# Patient Record
Sex: Male | Born: 1955 | Race: Black or African American | Hispanic: No | Smoking: Former smoker
Health system: Southern US, Community
[De-identification: ages and names within clinical notes are randomized; demographics above are authoritative.]

## PROBLEM LIST (undated history)

## (undated) DIAGNOSIS — S069X9A Unspecified intracranial injury with loss of consciousness of unspecified duration, initial encounter: Secondary | ICD-10-CM

## (undated) DIAGNOSIS — S069XAA Unspecified intracranial injury with loss of consciousness status unknown, initial encounter: Secondary | ICD-10-CM

## (undated) DIAGNOSIS — D693 Immune thrombocytopenic purpura: Secondary | ICD-10-CM

## (undated) DIAGNOSIS — F0390 Unspecified dementia without behavioral disturbance: Secondary | ICD-10-CM

## (undated) DIAGNOSIS — D509 Iron deficiency anemia, unspecified: Secondary | ICD-10-CM

## (undated) HISTORY — DX: Immune thrombocytopenic purpura: D69.3

## (undated) HISTORY — PX: CHOLECYSTECTOMY: SHX55

## (undated) HISTORY — DX: Iron deficiency anemia, unspecified: D50.9

---

## 2014-06-03 ENCOUNTER — Ambulatory Visit: Payer: Self-pay | Admitting: Gastroenterology

## 2014-06-06 LAB — PATHOLOGY REPORT

## 2016-03-08 ENCOUNTER — Emergency Department: Payer: Medicaid Other

## 2016-03-08 ENCOUNTER — Encounter: Payer: Self-pay | Admitting: Emergency Medicine

## 2016-03-08 ENCOUNTER — Emergency Department
Admission: EM | Admit: 2016-03-08 | Discharge: 2016-03-08 | Disposition: A | Discharge status: Home / Self Care | Payer: Medicaid Other | Attending: Emergency Medicine | Admitting: Emergency Medicine

## 2016-03-08 DIAGNOSIS — R4182 Altered mental status, unspecified: Secondary | ICD-10-CM | POA: Diagnosis present

## 2016-03-08 DIAGNOSIS — Z79899 Other long term (current) drug therapy: Secondary | ICD-10-CM | POA: Insufficient documentation

## 2016-03-08 DIAGNOSIS — R410 Disorientation, unspecified: Secondary | ICD-10-CM | POA: Insufficient documentation

## 2016-03-08 DIAGNOSIS — Z7982 Long term (current) use of aspirin: Secondary | ICD-10-CM | POA: Diagnosis not present

## 2016-03-08 LAB — COMPREHENSIVE METABOLIC PANEL
ALBUMIN: 4.4 g/dL (ref 3.5–5.0)
ALK PHOS: 104 U/L (ref 38–126)
ALT: 19 U/L (ref 17–63)
AST: 42 U/L — AB (ref 15–41)
Anion gap: 9 (ref 5–15)
BUN: 14 mg/dL (ref 6–20)
CALCIUM: 9.1 mg/dL (ref 8.9–10.3)
CO2: 25 mmol/L (ref 22–32)
CREATININE: 1.11 mg/dL (ref 0.61–1.24)
Chloride: 106 mmol/L (ref 101–111)
GFR calc Af Amer: >60 mL/min (ref >60)
GFR calc non Af Amer: >60 mL/min (ref >60)
GLUCOSE: 103 mg/dL — AB (ref 65–99)
Potassium: 5.5 mmol/L — ABNORMAL HIGH (ref 3.5–5.1)
Sodium: 140 mmol/L (ref 135–145)
Total Bilirubin: 1 mg/dL (ref 0.3–1.2)
Total Protein: 8 g/dL (ref 6.5–8.1)

## 2016-03-08 LAB — CBC
HCT: 27.3 % — ABNORMAL LOW (ref 40.0–52.0)
Hemoglobin: 8.2 g/dL — ABNORMAL LOW (ref 13.0–18.0)
MCH: 20 pg — AB (ref 26.0–34.0)
MCHC: 30 g/dL — AB (ref 32.0–36.0)
MCV: 66.5 fL — AB (ref 80.0–100.0)
PLATELETS: 59 10*3/uL — AB (ref 150–440)
RBC: 4.1 MIL/uL — ABNORMAL LOW (ref 4.40–5.90)
RDW: 22.7 % — ABNORMAL HIGH (ref 11.5–14.5)
WBC: 7.6 10*3/uL (ref 3.8–10.6)

## 2016-03-08 LAB — CK: CK TOTAL: 393 U/L (ref 49–397)

## 2016-03-08 MED ORDER — SODIUM CHLORIDE 0.9 % IV BOLUS (SEPSIS)
1000.0000 mL | Freq: Once | INTRAVENOUS | Status: AC
  Administered 2016-03-08: 1000 mL via INTRAVENOUS

## 2016-03-08 MED ORDER — PIPERACILLIN-TAZOBACTAM 3.375 G IVPB
INTRAVENOUS | Status: AC
  Filled 2016-03-08: qty 50

## 2016-03-08 NOTE — Discharge Instructions (Signed)
Confusion Confusion is the inability to think with your usual speed or clarity. Confusion may come on quickly or slowly over time. How quickly the confusion comes on depends on the cause. Confusion can be due to any number of causes. CAUSES   Concussion, head injury, or head trauma.  Seizures.  Stroke.  Fever.  Brain tumor.  Age related decreased brain function (dementia).  Heightened emotional states like rage or terror.  Mental illness in which the person loses the ability to determine what is real and what is not (hallucinations).  Infections such as a urinary tract infection (UTI).  Toxic effects from alcohol, drugs, or prescription medicines.  Dehydration and an imbalance of salts in the body (electrolytes).  Lack of sleep.  Low blood sugar (diabetes).  Low levels of oxygen from conditions such as chronic lung disorders.  Drug interactions or other medicine side effects.  Nutritional deficiencies, especially niacin, thiamine, vitamin C, or vitamin B.  Sudden drop in body temperature (hypothermia).  Change in routine, such as when traveling or hospitalized. SIGNS AND SYMPTOMS  People often describe their thinking as cloudy or unclear when they are confused. Confusion can also include feeling disoriented. That means you are unaware of where or who you are. You may also not know what the date or time is. If confused, you may also have difficulty paying attention, remembering, and making decisions. Some people also act aggressively when they are confused.  DIAGNOSIS  The medical evaluation of confusion may include:  Blood and urine tests.  X-rays.  Brain and nervous system tests.  Analyzing your brain waves (electroencephalogram or EEG).  Magnetic resonance imaging (MRI) of your head.  Computed tomography (CT) scan of your head.  Mental status tests in which your health care provider may ask many questions. Some of these questions may seem silly or strange,  but they are a very important test to help diagnose and treat confusion. TREATMENT  An admission to the hospital may not be needed, but a person with confusion should not be left alone. Stay with a family member or friend until the confusion clears. Avoid alcohol, pain relievers, or sedative drugs until you have fully recovered. Do not drive until directed by your health care provider. HOME CARE INSTRUCTIONS  What family and friends can do:  To find out if someone is confused, ask the person to state his or her name, age, and the date. If the person is unsure or answers incorrectly, he or she is confused.  Always introduce yourself, no matter how well the person knows you.  Often remind the person of his or her location.  Place a calendar and clock near the confused person.  Help the person with his or her medicines. You may want to use a pill box, an alarm as a reminder, or give the person each dose as prescribed.  Talk about current events and plans for the day.  Try to keep the environment calm, quiet, and peaceful.  Make sure the person keeps follow-up visits with his or her health care provider. PREVENTION  Ways to prevent confusion:  Avoid alcohol.  Eat a balanced diet.  Get enough sleep.  Take medicine only as directed by your health care provider.  Do not become isolated. Spend time with other people and make plans for your days.  Keep careful watch on your blood sugar levels if you are diabetic. SEEK IMMEDIATE MEDICAL CARE IF:   You develop severe headaches, repeated vomiting, seizures, blackouts, or   slurred speech.  There is increasing confusion, weakness, numbness, restlessness, or personality changes.  You develop a loss of balance, have marked dizziness, feel uncoordinated, or fall.  You have delusions, hallucinations, or develop severe anxiety.  Your family members think you need to be rechecked.   This information is not intended to replace advice given  to you by your health care provider. Make sure you discuss any questions you have with your health care provider.   Document Released: 10/11/2004 Document Revised: 09/24/2014 Document Reviewed: 10/09/2013 Elsevier Interactive Patient Education 2016 [employer] Inc.  
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
## 2016-03-08 NOTE — ED Notes (Signed)
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
## 2016-03-08 NOTE — ED Notes (Signed)
This RN spoke with patient cousin, Cathleen Fears. Pt cousin states pt lives at home with his mother, pt left home yesterday morning at 10 am and had not returned home, Thayer Headings notified the [employer] of pt missing. Thayer Headings reports pt has done this multiple times during the past year but have been able to find him when he leaves the home.

## 2016-04-10 ENCOUNTER — Inpatient Hospital Stay: Payer: Medicaid Other

## 2016-04-10 ENCOUNTER — Inpatient Hospital Stay: Payer: Medicaid Other | Attending: Hematology and Oncology | Admitting: Hematology and Oncology

## 2016-04-10 ENCOUNTER — Other Ambulatory Visit: Payer: Self-pay | Admitting: *Deleted

## 2016-04-10 ENCOUNTER — Encounter (INDEPENDENT_AMBULATORY_CARE_PROVIDER_SITE_OTHER): Payer: Self-pay

## 2016-04-10 ENCOUNTER — Encounter: Payer: Self-pay | Admitting: Hematology and Oncology

## 2016-04-10 VITALS — BP 121/80 | HR 53 | Temp 97.9°F | Resp 18 | Ht 70.0 in | Wt 181.9 lb

## 2016-04-10 DIAGNOSIS — Z8042 Family history of malignant neoplasm of prostate: Secondary | ICD-10-CM | POA: Insufficient documentation

## 2016-04-10 DIAGNOSIS — Z87891 Personal history of nicotine dependence: Secondary | ICD-10-CM | POA: Diagnosis not present

## 2016-04-10 DIAGNOSIS — Z79899 Other long term (current) drug therapy: Secondary | ICD-10-CM | POA: Insufficient documentation

## 2016-04-10 DIAGNOSIS — D693 Immune thrombocytopenic purpura: Secondary | ICD-10-CM | POA: Insufficient documentation

## 2016-04-10 DIAGNOSIS — D696 Thrombocytopenia, unspecified: Secondary | ICD-10-CM | POA: Insufficient documentation

## 2016-04-10 DIAGNOSIS — D649 Anemia, unspecified: Secondary | ICD-10-CM

## 2016-04-10 DIAGNOSIS — Z7982 Long term (current) use of aspirin: Secondary | ICD-10-CM | POA: Diagnosis not present

## 2016-04-10 DIAGNOSIS — Z87828 Personal history of other (healed) physical injury and trauma: Secondary | ICD-10-CM | POA: Diagnosis not present

## 2016-04-10 DIAGNOSIS — Z801 Family history of malignant neoplasm of trachea, bronchus and lung: Secondary | ICD-10-CM | POA: Diagnosis not present

## 2016-04-10 DIAGNOSIS — Z8041 Family history of malignant neoplasm of ovary: Secondary | ICD-10-CM | POA: Diagnosis not present

## 2016-04-10 LAB — COMPREHENSIVE METABOLIC PANEL
ALT: 11 U/L — ABNORMAL LOW (ref 17–63)
AST: 16 U/L (ref 15–41)
Albumin: 4.2 g/dL (ref 3.5–5.0)
Alkaline Phosphatase: 95 U/L (ref 38–126)
Anion gap: 6 (ref 5–15)
BUN: 16 mg/dL (ref 6–20)
CO2: 29 mmol/L (ref 22–32)
Calcium: 8.9 mg/dL (ref 8.9–10.3)
Chloride: 104 mmol/L (ref 101–111)
Creatinine, Ser: 0.82 mg/dL (ref 0.61–1.24)
GFR calc Af Amer: >60 mL/min (ref >60)
GFR calc non Af Amer: >60 mL/min (ref >60)
Glucose, Bld: 98 mg/dL (ref 65–99)
Potassium: 3.4 mmol/L — ABNORMAL LOW (ref 3.5–5.1)
Sodium: 139 mmol/L (ref 135–145)
Total Bilirubin: 0.7 mg/dL (ref 0.3–1.2)
Total Protein: 7.8 g/dL (ref 6.5–8.1)

## 2016-04-10 LAB — IRON AND TIBC
Iron: 196 ug/dL — ABNORMAL HIGH (ref 45–182)
Saturation Ratios: 43 % — ABNORMAL HIGH (ref 17.9–39.5)
TIBC: 457 ug/dL — ABNORMAL HIGH (ref 250–450)
UIBC: 261 ug/dL

## 2016-04-10 LAB — CBC WITH DIFFERENTIAL/PLATELET
Basophils Absolute: 0 K/uL (ref 0–0.1)
Basophils Relative: 1 %
Eosinophils Absolute: 0.1 K/uL (ref 0–0.7)
Eosinophils Relative: 1 %
HCT: 30.6 % — ABNORMAL LOW (ref 39.0–52.0)
Hemoglobin: 9.1 g/dL — ABNORMAL LOW (ref 13.0–17.0)
Lymphocytes Relative: 41 %
Lymphs Abs: 2.2 K/uL (ref 1.0–3.6)
MCH: 20.6 pg — ABNORMAL LOW (ref 26.0–34.0)
MCHC: 29.9 g/dL — ABNORMAL LOW (ref 32.0–36.0)
MCV: 69 fL — ABNORMAL LOW (ref 80.0–100.0)
Monocytes Absolute: 0.7 K/uL (ref 0.2–1.0)
Monocytes Relative: 13 %
Neutro Abs: 2.4 K/uL (ref 1.4–6.5)
Neutrophils Relative %: 44 %
Platelets: 88 K/uL — ABNORMAL LOW (ref 150–440)
RBC: 4.43 MIL/uL (ref 4.40–5.90)
RDW: 25.6 % — ABNORMAL HIGH (ref 11.5–14.5)
WBC: 5.4 K/uL (ref 3.8–10.6)

## 2016-04-10 LAB — RETICULOCYTES
RBC.: 4.43 MIL/uL (ref 4.40–5.90)
Retic Count, Absolute: 146.2 10*3/uL (ref 19.0–183.0)
Retic Ct Pct: 3.3 % — ABNORMAL HIGH (ref 0.4–3.1)

## 2016-04-10 LAB — FERRITIN: Ferritin: 10 ng/mL — ABNORMAL LOW (ref 24–336)

## 2016-04-10 LAB — TSH: TSH: 0.366 u[IU]/mL (ref 0.350–4.500)

## 2016-04-10 NOTE — Progress Notes (Signed)
Patient is here for new patient evaluation no complaints or concerns today.
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
## 2016-04-11 LAB — HEPATITIS B CORE ANTIBODY, TOTAL: Hep B Core Total Ab: NEGATIVE

## 2016-04-11 LAB — HEPATITIS C ANTIBODY: HCV Ab: <0.1 s/co ratio (ref 0.0–0.9)

## 2016-04-11 LAB — HIV ANTIBODY (ROUTINE TESTING W REFLEX): HIV Screen 4th Generation wRfx: NONREACTIVE

## 2016-04-11 LAB — ANA W/REFLEX IF POSITIVE: Anti Nuclear Antibody(ANA): NEGATIVE

## 2016-04-11 LAB — HEPATITIS B SURFACE ANTIGEN: Hepatitis B Surface Ag: NEGATIVE

## 2016-04-14 ENCOUNTER — Encounter: Payer: Self-pay | Admitting: Hematology and Oncology

## 2016-04-27 ENCOUNTER — Other Ambulatory Visit: Payer: Self-pay | Admitting: *Deleted

## 2016-04-27 ENCOUNTER — Inpatient Hospital Stay: Payer: Medicaid Other | Attending: Hematology and Oncology | Admitting: Hematology and Oncology

## 2016-04-27 ENCOUNTER — Inpatient Hospital Stay: Payer: Medicaid Other | Admitting: Hematology and Oncology

## 2016-04-27 VITALS — BP 117/83 | HR 55 | Temp 96.1°F | Resp 18 | Wt 183.0 lb

## 2016-04-27 DIAGNOSIS — D696 Thrombocytopenia, unspecified: Secondary | ICD-10-CM

## 2016-04-27 DIAGNOSIS — Z87891 Personal history of nicotine dependence: Secondary | ICD-10-CM | POA: Insufficient documentation

## 2016-04-27 DIAGNOSIS — Z8042 Family history of malignant neoplasm of prostate: Secondary | ICD-10-CM | POA: Diagnosis not present

## 2016-04-27 DIAGNOSIS — Z800 Family history of malignant neoplasm of digestive organs: Secondary | ICD-10-CM | POA: Diagnosis not present

## 2016-04-27 DIAGNOSIS — Z79899 Other long term (current) drug therapy: Secondary | ICD-10-CM | POA: Diagnosis not present

## 2016-04-27 DIAGNOSIS — D509 Iron deficiency anemia, unspecified: Secondary | ICD-10-CM | POA: Diagnosis present

## 2016-04-27 DIAGNOSIS — Z7982 Long term (current) use of aspirin: Secondary | ICD-10-CM | POA: Diagnosis not present

## 2016-04-27 DIAGNOSIS — D649 Anemia, unspecified: Secondary | ICD-10-CM

## 2016-04-27 DIAGNOSIS — Z87828 Personal history of other (healed) physical injury and trauma: Secondary | ICD-10-CM | POA: Diagnosis not present

## 2016-04-27 DIAGNOSIS — D122 Benign neoplasm of ascending colon: Secondary | ICD-10-CM | POA: Diagnosis not present

## 2016-04-27 DIAGNOSIS — Z8041 Family history of malignant neoplasm of ovary: Secondary | ICD-10-CM | POA: Diagnosis not present

## 2016-04-27 NOTE — Progress Notes (Signed)
[facility] day:  05/28/16  Chief Complaint: Julian [name] is a 60 y.o. male with thrombocytopenia who seen for review of initial work-up and discussion regarding direction of therapy.  HPI:  The patient was last seen in the hematology clinic on 04/10/2016 for initial consultation.  He had thrombocytopenia of unclear duration.  He had a microcytic anemia likely due to iron deficiency.  Diet appeared good.  He denied any melena or hematochezia.  His last colonoscopy was within the past 5 years (no report available).  He has been on oral B12 since 2015.  He has been on oral iron x 3 weeks.  Hematocrit had improved from 27.1 to 30.2.  Platelet count had improved from 59,000 to 103,000.  He underwent a work-up.  CBC revealed a hematocrit of 30.6, hemoglobin 9.1, MCV 69, platelets 88,000, WBC 5400 with an ANC of 2400.  Differential was unremarkable.  Negative studies were as follows:  Hepatitis B surface antigen, hepatitis B core antibody total, hepatitis C antibody, HIV antibody, ANA, and TSH.  Ferritin was 10 (low) with a TIBC of 457 (high) and a saturation of 43%.  Retic was 3.3%.  No colonoscopy report is available.  Colonoscopy was performed by Dr. Verdie Shire on 06/03/2014.  Pathology from 06/03/2014 revealed a tubular adenoma in the ascending colon negative for high grade dysplasia and malignancy.  Symptomatically, he denies any complaints.  He is taking 1 iron pill a day.  He did not turn in guaiac cards ("not going to do it').  No past medical history on file.  Past Surgical History:  Procedure Laterality Date  . CHOLECYSTECTOMY      Family History  Problem Relation Age of Onset  . Hypertension Father   . Diabetes Father   . Lung cancer Brother   . Hypertension Maternal Grandmother   . Ovarian cancer Maternal Grandmother   . Diabetes Maternal Grandmother   . Prostate cancer Brother     Social History:  reports that he has quit  smoking. He does not have any smokeless tobacco history on file. He reports that he does not drink alcohol. His drug history is not on file.  He previously smoked 1 pack per day.  He quit smoking this year.  He does not drink alcohol.  He does not take any non prescription drugs.  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  He denies any exposure to radiation or toxins.  He was in the [employer] for a few months.  He lives with his mom.  He has 1 son.  The patient is accompanied by his cousin, Thayer Headings, today.  Allergies: No Known Allergies  Current Medications: Current Outpatient Prescriptions  Medication Sig Dispense Refill  . aspirin (ASPIRIN CHILDRENS) 81 MG chewable tablet Chew 81 mg by mouth daily.    Marland Kitchen donepezil (ARICEPT) 10 MG tablet Take 10 mg by mouth at bedtime.    . ferrous sulfate 325 (65 FE) MG tablet Take 325 mg by mouth daily with breakfast.    . gabapentin (NEURONTIN) 100 MG capsule Take 100 mg by mouth 2 (two) times daily.  11  . memantine (NAMENDA) 5 MG tablet Take 5 mg by mouth 2 (two) times daily.  5   No current facility-administered medications for this visit.     Review of Systems:  GENERAL:  No problems.  No fevers, sweats or weight loss. PERFORMANCE STATUS (ECOG):  2 HEENT:  Possible glaucoma.  No visual  changes, runny nose, sore throat, mouth sores or tenderness. Lungs: No shortness of breath or cough.  No hemoptysis. Cardiac:  No chest pain, palpitations, orthopnea, or PND. GI:  No nausea, vomiting, diarrhea, constipation, melena or hematochezia. GU:  No urgency, frequency, dysuria, or hematuria. Musculoskeletal:  No back pain.  No joint pain.  No muscle tenderness. Extremities:  No pain or swelling. Skin:  No rashes or skin changes. Neuro:  No headache, numbness or weakness, balance or coordination issues. Endocrine:  No diabetes, thyroid issues, hot flashes or night sweats. Psych:  No mood changes, depression or anxiety. Pain:  No focal pain. Review of systems:   All other systems reviewed and found to be negative.  Physical Exam: Blood pressure 117/83, pulse (!) 55, temperature (!) 96.1 F (35.6 C), temperature source Tympanic, resp. rate 18, weight 182 lb 15.7 oz (83 kg). GENERAL:  Well developed, well nourished, sitting comfortably in the exam room in no acute distress. MENTAL STATUS:  Alert and oriented to person, place and time. HEAD:  Short black hair.  Mustache.  Gray sideburns.  Normocephalic, atraumatic, face symmetric, no Cushingoid features. EYES:  Brown eyes.  No conjunctivitis or scleral icterus. NEUROLOGICAL: Unremarkable. PSYCH:  Appropriate.   No visits with results within 3 Day(s) from this visit.  Latest known visit with results is:  Appointment on 04/10/2016  Component Date Value Ref Range Status  . WBC 04/10/2016 5.4  3.8 - 10.6 K/uL Final  . RBC 04/10/2016 4.43  4.40 - 5.90 MIL/uL Final  . Hemoglobin 04/10/2016 9.1* 13.0 - 17.0 g/dL Final  . HCT 04/10/2016 30.6* 39.0 - 52.0 % Final  . MCV 04/10/2016 69.0* 80.0 - 100.0 fL Final  . MCH 04/10/2016 20.6* 26.0 - 34.0 pg Final  . MCHC 04/10/2016 29.9* 32.0 - 36.0 g/dL Final  . RDW 04/10/2016 25.6* 11.5 - 14.5 % Final  . Platelets 04/10/2016 88* 150 - 440 K/uL Final   Comment: PLATELET COUNT CONFIRMED BY SMEAR LARGE PLATELETS PRESENT   . Neutrophils Relative % 04/10/2016 44%  % Final  . Neutro Abs 04/10/2016 2.4  1.4 - 6.5 K/uL Final  . Lymphocytes Relative 04/10/2016 41%  % Final  . Lymphs Abs 04/10/2016 2.2  1.0 - 3.6 K/uL Final  . Monocytes Relative 04/10/2016 13%  % Final  . Monocytes Absolute 04/10/2016 0.7  0.2 - 1.0 K/uL Final  . Eosinophils Relative 04/10/2016 1%  % Final  . Eosinophils Absolute 04/10/2016 0.1  0 - 0.7 K/uL Final  . Basophils Relative 04/10/2016 1%  % Final  . Basophils Absolute 04/10/2016 0.0  0 - 0.1 K/uL Final  . Sodium 04/10/2016 139  135 - 145 mmol/L Final  . Potassium 04/10/2016 3.4* 3.5 - 5.1 mmol/L Final  . Chloride 04/10/2016 104  101 -  111 mmol/L Final  . CO2 04/10/2016 29  22 - 32 mmol/L Final  . Glucose, Bld 04/10/2016 98  65 - 99 mg/dL Final  . BUN 04/10/2016 16  6 - 20 mg/dL Final  . Creatinine, Ser 04/10/2016 0.82  0.61 - 1.24 mg/dL Final  . Calcium 04/10/2016 8.9  8.9 - 10.3 mg/dL Final  . Total Protein 04/10/2016 7.8  6.5 - 8.1 g/dL Final  . Albumin 04/10/2016 4.2  3.5 - 5.0 g/dL Final  . AST 04/10/2016 16  15 - 41 U/L Final  . ALT 04/10/2016 11* 17 - 63 U/L Final  . Alkaline Phosphatase 04/10/2016 95  38 - 126 U/L Final  . Total Bilirubin 04/10/2016  0.7  0.3 - 1.2 mg/dL Final  . GFR calc non Af Amer 04/10/2016 >60  >60 mL/min Final  . GFR calc Af Amer 04/10/2016 >60  >60 mL/min Final   Comment: (NOTE) The eGFR has been calculated using the CKD EPI equation. This calculation has not been validated in all clinical situations. eGFR's persistently <60 mL/min signify possible Chronic Kidney Disease.   . Anion gap 04/10/2016 6  5 - 15 Final  . Hepatitis B Surface Ag 04/11/2016 Negative  Negative Final   Comment: (NOTE) Performed At: [facility] [place], [place] [id number] Lindon Romp MD [id number]   . Hep B Core Total Ab 04/11/2016 Negative  Negative Final   Comment: (NOTE) Performed At: [facility] [place], [place] [id number] Lindon Romp MD [id number]   . HCV Ab 04/11/2016 <0.1  0.0 - 0.9 s/co ratio Final   Comment: (NOTE)                                  Negative:     < 0.8                             Indeterminate: 0.8 - 0.9                                  Positive:     > 0.9 The CDC recommends that a positive HCV antibody result be followed up with a HCV Nucleic Acid Amplification test (536144). Performed At: [facility] [place], [place] [id number] Lindon Romp MD YP:[id number]   . HIV Screen 4th Generation wRfx 04/11/2016 Non Reactive  Non Reactive Final   Comment: (NOTE) Performed At: [facility] [place], [place] [id number] Lindon Romp MD AS:[id number]   . Anit Nuclear Antibody(ANA) 04/11/2016 Negative  Negative Final   Comment: (NOTE) Performed At: [facility] [place], [place] [id number] Lindon Romp MD XB:[id number]   . Ferritin 04/10/2016 10* 24 - 336 ng/mL Final  . Iron 04/10/2016 196* 45 - 182 ug/dL Final  . TIBC 04/10/2016 457* 250 - 450 ug/dL Final  . Saturation Ratios 04/10/2016 43* 17.9 - 39.5 % Final  . UIBC 04/10/2016 261  ug/dL Final  . TSH 04/10/2016 0.366  0.350 - 4.500 uIU/mL Final  . Retic Ct Pct 04/10/2016 3.3* 0.4 - 3.1 % Final  . RBC. 04/10/2016 4.43  4.40 - 5.90 MIL/uL Final  . Retic Count, Manual 04/10/2016 146.2  19.0 - 183.0 K/uL Final    Assessment:  Julian [name] is a 60 y.o. male with thrombocytopenia of unclear duration.  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  Platelet count has improved from 59,000 to 103,000.  He has iron deficiency anemia.  Diet appears good.  He denies any melena or hematochezia.  He denies any pica. Colonoscopy on 06/03/2014 revealed a tubular adenoma in the ascending colon negative for high grade dysplasia and malignancy.  He declines guaiac cards.  He has been on oral iron (1 tablet/day) x 5 weeks.    Work-up on 04/10/2016 revealed a hematocrit of 30.6, hemoglobin 9.1, MCV 69, platelets 88,000, WBC 5400 with an ANC of 2400.  Differential was unremarkable.  Negative studies included:  hepatitis B  surface antigen, hepatitis B core antibody total, hepatitis C antibody, HIV antibody, ANA, and TSH.  Ferritin was 10 (low) with a TIBC of 457 (high) and a saturation of 43%.  Retic was 3.3%.  He has a history of head trauma in the 1970s.  Symptomatically, he denies any complaint.  Exam reveals no adenopathy or hepatosplenomegaly.  Hematocrit has improved from 27.1 to 30.2 on oral iron.   Plan: 1.  Review work-up.  Discuss iron deficiency  anemia and thrombocytopenia.  Thrombocytopenia is mild and possibly related to immune mediated thrombocytopenic purpura (ITP). 2.  Increase iron to BID. 3.  Discuss guaiac cards.  Patient declines. 4.  Abdominal ultrasound in next 1-2 weeks 5.  RTC in 1 month for MD assess, labs (CBC with diff, platelet count in blue top tube, ferritin).   Lequita Asal, MD  05/28/2016, 10:25 AM

## 2016-04-27 NOTE — Progress Notes (Signed)
States is feeling well. Offers no complaints. 

## 2016-05-11 ENCOUNTER — Ambulatory Visit
Admission: RE | Admit: 2016-05-11 | Discharge: 2016-05-11 | Disposition: A | Discharge status: Home / Self Care | Payer: Medicaid Other | Source: Ambulatory Visit | Attending: Hematology and Oncology | Admitting: Hematology and Oncology

## 2016-05-11 DIAGNOSIS — Z9049 Acquired absence of other specified parts of digestive tract: Secondary | ICD-10-CM | POA: Diagnosis not present

## 2016-05-11 DIAGNOSIS — D696 Thrombocytopenia, unspecified: Secondary | ICD-10-CM | POA: Insufficient documentation

## 2016-05-28 ENCOUNTER — Inpatient Hospital Stay: Payer: Medicaid Other | Attending: Hematology and Oncology

## 2016-05-28 ENCOUNTER — Other Ambulatory Visit: Payer: Self-pay

## 2016-05-28 ENCOUNTER — Inpatient Hospital Stay (HOSPITAL_BASED_OUTPATIENT_CLINIC_OR_DEPARTMENT_OTHER): Payer: Medicaid Other | Admitting: Hematology and Oncology

## 2016-05-28 VITALS — BP 129/86 | HR 56 | Temp 94.9°F | Resp 18 | Wt 181.9 lb

## 2016-05-28 DIAGNOSIS — D509 Iron deficiency anemia, unspecified: Secondary | ICD-10-CM | POA: Diagnosis not present

## 2016-05-28 DIAGNOSIS — Z87828 Personal history of other (healed) physical injury and trauma: Secondary | ICD-10-CM

## 2016-05-28 DIAGNOSIS — D696 Thrombocytopenia, unspecified: Secondary | ICD-10-CM

## 2016-05-28 DIAGNOSIS — Z8041 Family history of malignant neoplasm of ovary: Secondary | ICD-10-CM | POA: Diagnosis not present

## 2016-05-28 DIAGNOSIS — Z8042 Family history of malignant neoplasm of prostate: Secondary | ICD-10-CM | POA: Insufficient documentation

## 2016-05-28 DIAGNOSIS — Z79899 Other long term (current) drug therapy: Secondary | ICD-10-CM | POA: Insufficient documentation

## 2016-05-28 DIAGNOSIS — Z87891 Personal history of nicotine dependence: Secondary | ICD-10-CM | POA: Diagnosis not present

## 2016-05-28 DIAGNOSIS — D122 Benign neoplasm of ascending colon: Secondary | ICD-10-CM | POA: Insufficient documentation

## 2016-05-28 DIAGNOSIS — Z7982 Long term (current) use of aspirin: Secondary | ICD-10-CM | POA: Diagnosis not present

## 2016-05-28 DIAGNOSIS — D649 Anemia, unspecified: Secondary | ICD-10-CM

## 2016-05-28 LAB — CBC WITH DIFFERENTIAL/PLATELET
Basophils Absolute: 0 10*3/uL (ref 0–0.1)
Basophils Relative: 1 %
Eosinophils Absolute: 0.1 10*3/uL (ref 0–0.7)
Eosinophils Relative: 2 %
HCT: 41.4 % (ref 40.0–52.0)
Hemoglobin: 13.1 g/dL (ref 13.0–18.0)
Lymphocytes Relative: 50 %
Lymphs Abs: 1.9 10*3/uL (ref 1.0–3.6)
MCH: 26.3 pg (ref 26.0–34.0)
MCHC: 31.7 g/dL — ABNORMAL LOW (ref 32.0–36.0)
MCV: 82.8 fL (ref 80.0–100.0)
Monocytes Absolute: 0.4 10*3/uL (ref 0.2–1.0)
Monocytes Relative: 10 %
Neutro Abs: 1.4 10*3/uL (ref 1.4–6.5)
Neutrophils Relative %: 37 %
Platelets: 71 10*3/uL — ABNORMAL LOW (ref 150–440)
RBC: 5 MIL/uL (ref 4.40–5.90)
RDW: 27.2 % — ABNORMAL HIGH (ref 11.5–14.5)
WBC: 3.8 10*3/uL (ref 3.8–10.6)

## 2016-05-28 LAB — FERRITIN: Ferritin: 30 ng/mL (ref 24–336)

## 2016-05-28 NOTE — Progress Notes (Signed)
Patient offers no complaints today.  States he will be seeing his PCP tomorrow.

## 2016-05-28 NOTE — Progress Notes (Signed)
[facility] day:  05/28/2016  Chief Complaint: Julian [name] is a 60 y.o. male with iron deficiency anemia and mild thrombocytopenia who seen for 1 month assessment.  HPI:  The patient was last seen in the hematology clinic on 04/27/2016 for review of work-up. Evaluation revealed iron deficiency anemia. He appeared to have mild immune mediated thrombocytopenic purpura (ITP). An abdominal ultrasound was ordered to assess his liver and spleen. He was encouraged to increase oral iron from 1 tablet a day to 2 tablets a day. Hematocrit was improving.  Abdominal ultrasound on 05/11/2016 revealed a normal spleen and liver.  There was a possible 3.3 cm proximal abdominal aortic aneurysm.  Recommendation was for followup by ultrasound in 3 years.   Symptomatically, he denies any complaint.  He is taking 2 iron pills a day and tolerating it well.  He is eating better (hamburger and green leafy vegetables).  He denies any melena or hematochezia.  History reviewed. No pertinent past medical history.  Past Surgical History:  Procedure Laterality Date  . CHOLECYSTECTOMY      Family History  Problem Relation Age of Onset  . Hypertension Father   . Diabetes Father   . Lung cancer Brother   . Hypertension Maternal Grandmother   . Ovarian cancer Maternal Grandmother   . Diabetes Maternal Grandmother   . Prostate cancer Brother     Social History:  reports that he has quit smoking. He does not have any smokeless tobacco history on file. He reports that he does not drink alcohol. His drug history is not on file.  He previously smoked 1 pack per day.  He quit smoking this year.  He does not drink alcohol.  He does not take any non prescription drugs.  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  He denies any exposure to radiation or toxins.  He was in the [employer] for a few months.  He lives with his mom.  He has 1 son.   Allergies: No Known  Allergies  Current Medications: Current Outpatient Prescriptions  Medication Sig Dispense Refill  . aspirin (ASPIRIN CHILDRENS) 81 MG chewable tablet Chew 81 mg by mouth daily.    Marland Kitchen donepezil (ARICEPT) 10 MG tablet Take 10 mg by mouth at bedtime.    . ferrous sulfate 325 (65 FE) MG tablet Take 325 mg by mouth daily with breakfast.    . gabapentin (NEURONTIN) 100 MG capsule Take 100 mg by mouth 2 (two) times daily.  11  . memantine (NAMENDA) 5 MG tablet Take 5 mg by mouth 2 (two) times daily.  5   No current facility-administered medications for this visit.     Review of Systems:  GENERAL:  Feels "ok".  No fevers, sweats or weight loss. PERFORMANCE STATUS (ECOG):  2 HEENT:  Possible glaucoma.  No visual changes, runny nose, sore throat, mouth sores or tenderness. Lungs: No shortness of breath or cough.  No hemoptysis. Cardiac:  No chest pain, palpitations, orthopnea, or PND. GI:  No nausea, vomiting, diarrhea, constipation, melena or hematochezia. GU:  No urgency, frequency, dysuria, or hematuria. Musculoskeletal:  No back pain.  No joint pain.  No muscle tenderness. Extremities:  No pain or swelling. Skin:  No rashes or skin changes. Neuro:  No headache, numbness or weakness, balance or coordination issues. Endocrine:  No diabetes, thyroid issues, hot flashes or night sweats. Psych:  No mood changes, depression or anxiety. Pain:  No focal pain. Review  of systems:  All other systems reviewed and found to be negative.  Physical Exam: Blood pressure 129/86, pulse (!) 56, temperature (!) 94.9 F (34.9 C), temperature source Tympanic, resp. rate 18, weight 181 lb 14.1 oz (82.5 kg). GENERAL:  Slightly disheveled odiferous appearing gentleman sitting comfortably in the exam room in no acute distress. MENTAL STATUS:  Alert and oriented to person, place and time. HEAD:  Short black hair.  Mustache.  Gray sideburns and beard.  Normocephalic, atraumatic, face symmetric, no Cushingoid  features. EYES:  Brown eyes.  Pupils equal round and reactive to light and accomodation.  No conjunctivitis or scleral icterus. ENT:  Oropharynx clear without lesion.  Edentulous. Tongue normal. Mucous membranes moist.  RESPIRATORY:  Clear to auscultation without rales, wheezes or rhonchi. CARDIOVASCULAR:  Regular rate and rhythm without murmur, rub or gallop. ABDOMEN:  Soft, non-tender, with active bowel sounds, and no hepatosplenomegaly.  No masses. SKIN:  No rashes, ulcers or lesions. EXTREMITIES: No edema, no skin discoloration or tenderness.  No palpable cords. LYMPH NODES: No palpable cervical, supraclavicular, axillary or inguinal adenopathy  NEUROLOGICAL: Unremarkable. PSYCH:  Little sleepy.  Appropriate.   Orders Only on 05/28/2016  Component Date Value Ref Range Status  . WBC 05/28/2016 3.8  3.8 - 10.6 K/uL Final  . RBC 05/28/2016 5.00  4.40 - 5.90 MIL/uL Final  . Hemoglobin 05/28/2016 13.1  13.0 - 18.0 g/dL Final  . HCT 05/28/2016 41.4  40.0 - 52.0 % Final  . MCV 05/28/2016 82.8  80.0 - 100.0 fL Final  . MCH 05/28/2016 26.3  26.0 - 34.0 pg Final  . MCHC 05/28/2016 31.7* 32.0 - 36.0 g/dL Final  . RDW 05/28/2016 27.2* 11.5 - 14.5 % Final  . Platelets 05/28/2016 71* 150 - 440 K/uL Final   Comment: PLATELET COUNT CONFIRMED BY SMEAR LARGE PLATELETS PRESENT GIANT PLATELETS SEEN   . Neutrophils Relative % 05/28/2016 37%  % Final  . Neutro Abs 05/28/2016 1.4  1.4 - 6.5 K/uL Final  . Lymphocytes Relative 05/28/2016 50%  % Final  . Lymphs Abs 05/28/2016 1.9  1.0 - 3.6 K/uL Final  . Monocytes Relative 05/28/2016 10%  % Final  . Monocytes Absolute 05/28/2016 0.4  0.2 - 1.0 K/uL Final  . Eosinophils Relative 05/28/2016 2%  % Final  . Eosinophils Absolute 05/28/2016 0.1  0 - 0.7 K/uL Final  . Basophils Relative 05/28/2016 1%  % Final  . Basophils Absolute 05/28/2016 0.0  0 - 0.1 K/uL Final  . Ferritin 05/28/2016 30  24 - 336 ng/mL Final    Assessment:  Julian [name] is a  60 y.o. 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  Abdominal ultrasound on 05/11/2016 revealed a normal spleen and liver. Platelet count fluctuates (59,000 to 103,000).   He has iron deficiency anemia.  Diet appears good.  He denies any melena or hematochezia.  He denies any pica. Colonoscopy on 06/03/2014 revealed a tubular adenoma in the ascending colon negative for high grade dysplasia and malignancy.  He declines guaiac cards.  He has been on oral iron x 2 months.    Work-up on 04/10/2016 revealed a hematocrit of 30.6, hemoglobin 9.1, MCV 69, platelets 88,000, WBC 5400 with an ANC of 2400.  Differential was unremarkable.  Negative studies included:  hepatitis B surface antigen, hepatitis B core antibody total, hepatitis C antibody, HIV antibody, ANA, and TSH.  Ferritin was 10 (low) with a TIBC of 457 (high) and a saturation of  43%.  Retic was 3.3%.  He has a history of head trauma in the 1970s.  Symptomatically, he denies any complaint.  Exam reveals no adenopathy or hepatosplenomegaly.  Hematocrit has improved from 27.1 to 41.4 on oral iron.   Plan: 1.  Labs today:  CBC with diff, ferritin. 2.  Review abdominal ultrasound.  Liver and spleen are normal.  Discuss small aortic aneurysm which needs follow-up/monitoring. 3.  Continue oral iron until ferritin 100. 4.  RTC in 1 month for MD assess, labs (CBC with diff, ferritin, B12, folate).   Lequita Asal, MD  05/28/2016, 10:10 AM

## 2016-05-29 ENCOUNTER — Other Ambulatory Visit: Payer: Self-pay | Admitting: *Deleted

## 2016-05-29 DIAGNOSIS — D509 Iron deficiency anemia, unspecified: Secondary | ICD-10-CM

## 2016-05-29 DIAGNOSIS — D696 Thrombocytopenia, unspecified: Secondary | ICD-10-CM

## 2016-06-03 ENCOUNTER — Encounter: Payer: Self-pay | Admitting: Hematology and Oncology

## 2016-06-24 NOTE — Progress Notes (Signed)
[facility] day:  06/25/2016   Chief Complaint: [name] is a 60 y.o. male with iron deficiency anemia and mild thrombocytopenia who seen for 1 month assessment.  HPI:  The patient was last seen in the hematology clinic on 05/28/2016.  At that [name], Julian [name] was eating better (hamburger and green leafy vegetables).  Julian denied any melena or hematochezia.  Platelet count was 71,000.  Hematocrit had improved from 27.1 to 41.4 on oral iron.   During the interim, Julian has done well. Julian is taking 2 iron pills a day plus orange juice/vitamin C.  Julian denies any new medications or herbal products.  Julian does not drink alcohol.  Julian denies any infections.  Julian denies any bruising or bleeding.   History reviewed. No pertinent past medical history.  Past Surgical History:  Procedure Laterality Date  . CHOLECYSTECTOMY      Family History  Problem Relation Age of Onset  . Hypertension Father   . Diabetes Father   . Lung cancer Brother   . Hypertension Maternal Grandmother   . Ovarian cancer Maternal Grandmother   . Diabetes Maternal Grandmother   . Prostate cancer Brother     Social History:  reports that Julian has quit smoking. Julian does not have any smokeless tobacco history on file. Julian reports that Julian does not drink alcohol. His drug history is not on file.  Julian previously smoked 1 pack per day.  Julian quit smoking this year.  Julian does not drink alcohol.  Julian does not take any non prescription drugs.  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  Julian denies any exposure to radiation or toxins.  Julian was in the [employer] for a few months.  Julian lives with his mom.  Julian has 1 son.  Julian is accompanied by his cousin, Thayer Headings.  Allergies: No Known Allergies  Current Medications: Current Outpatient Prescriptions  Medication Sig Dispense Refill  . aspirin (ASPIRIN CHILDRENS) 81 MG chewable tablet  Chew 81 mg by mouth daily.    Marland Kitchen donepezil (ARICEPT) 10 MG tablet Take 10 mg by mouth at bedtime.    . ferrous sulfate 325 (65 FE) MG tablet Take 325 mg by mouth daily with breakfast.    . gabapentin (NEURONTIN) 100 MG capsule Take 100 mg by mouth 2 (two) times daily.  11  . memantine (NAMENDA) 10 MG tablet Take by mouth.     No current facility-administered medications for this visit.     Review of Systems:  GENERAL:  Feels "fine".  No fevers or sweats.  Weight up 2 pounds. PERFORMANCE STATUS (ECOG):  2 HEENT:  Possible glaucoma.  No visual changes, runny nose, sore throat, mouth sores or tenderness. Lungs: No shortness of breath or cough.  No hemoptysis. Cardiac:  No chest pain, palpitations, orthopnea, or PND. GI:  No nausea, vomiting, diarrhea, constipation, melena or hematochezia. GU:  No urgency, frequency, dysuria, or hematuria. Musculoskeletal:  No back pain.  No joint pain.  No muscle tenderness. Extremities:  No pain or swelling. Skin:  No rashes or skin changes. Neuro:  No headache, numbness or weakness, balance or coordination issues. Endocrine:  No diabetes, thyroid issues, hot flashes or night sweats. Psych:  No mood changes, depression or anxiety. Pain:  No focal pain. Review of systems:  All other systems reviewed and found to be  negative.  Physical Exam: Blood pressure (!) 136/93, pulse (!) 56, temperature (!) 94.3 F (34.6 C), temperature source Tympanic, resp. rate 18, weight 184 lb 1.4 oz (83.5 kg). GENERAL:  Slightly disheveled appearing gentleman sitting comfortably in the exam room in no acute distress. MENTAL STATUS:  Alert and oriented to person, place and [name]. HEAD:  Short black hair.  Mustache.  Gray sideburns and beard.  Normocephalic, atraumatic, face symmetric, no Cushingoid features. EYES:  Brown eyes.  Pupils equal round and reactive to light and accomodation.  No conjunctivitis or scleral icterus. ENT:  Oropharynx clear without lesion.  Edentulous.  Tongue normal. Mucous membranes moist.  RESPIRATORY:  Clear to auscultation without rales, wheezes or rhonchi. CARDIOVASCULAR:  Regular rate and rhythm without murmur, rub or gallop. ABDOMEN:  Soft, non-tender, with active bowel sounds, and no hepatosplenomegaly.  No masses. SKIN:  No rashes, ulcers or lesions. EXTREMITIES: No edema, no skin discoloration or tenderness.  No palpable cords. LYMPH NODES: No palpable cervical, supraclavicular, axillary or inguinal adenopathy  NEUROLOGICAL: Unremarkable. PSYCH:  Appropriate.   Orders Only on 06/25/2016  Component Date Value Ref Range Status  . WBC 06/25/2016 5.0  3.8 - 10.6 K/uL Final  . RBC 06/25/2016 4.90  4.40 - 5.90 MIL/uL Final  . Hemoglobin 06/25/2016 14.1  13.0 - 18.0 g/dL Final  . HCT 06/25/2016 43.2  40.0 - 52.0 % Final  . MCV 06/25/2016 88.1  80.0 - 100.0 fL Final  . MCH 06/25/2016 28.8  26.0 - 34.0 pg Final  . MCHC 06/25/2016 32.7  32.0 - 36.0 g/dL Final  . RDW 06/25/2016 22.0* 11.5 - 14.5 % Final  . Platelets 06/25/2016 42* 150 - 440 K/uL Final   Comment: LARGE PLATELETS PRESENT PLATELET COUNT CONFIRMED BY SMEAR   . Neutrophils Relative % 06/25/2016 31  % Final  . Neutro Abs 06/25/2016 1.6  1.4 - 6.5 K/uL Final  . Lymphocytes Relative 06/25/2016 52  % Final  . Lymphs Abs 06/25/2016 2.6  1.0 - 3.6 K/uL Final  . Monocytes Relative 06/25/2016 14  % Final  . Monocytes Absolute 06/25/2016 0.7  0.2 - 1.0 K/uL Final  . Eosinophils Relative 06/25/2016 2  % Final  . Eosinophils Absolute 06/25/2016 0.1  0 - 0.7 K/uL Final  . Basophils Relative 06/25/2016 1  % Final  . Basophils Absolute 06/25/2016 0.0  0 - 0.1 K/uL Final    Assessment:  Julian [name] is a 60 y.o. 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  Abdominal ultrasound on 05/11/2016 revealed a normal spleen and liver. Platelet count fluctuates (59,000 to 103,000).   Julian has iron deficiency anemia.  Diet  appears good.  Julian denies any melena or hematochezia.  Julian denies any pica. Colonoscopy on 06/03/2014 revealed a tubular adenoma in the ascending colon negative for high grade dysplasia and malignancy.  Julian declined guaiac cards.  Julian has been on oral iron x 3 months.    Work-up on 04/10/2016 revealed a hematocrit of 30.6, hemoglobin 9.1, MCV 69, platelets 88,000, WBC 5400 with an ANC of 2400.  Differential was unremarkable.  Negative studies included:  hepatitis B surface antigen, hepatitis B core antibody total, hepatitis C antibody, HIV antibody, ANA, and TSH.  Ferritin was 10 (low) with a TIBC of 457 (high) and a saturation of 43%.  Retic was 3.3%.  Julian has a history of head trauma in the 1970s.  Symptomatically, Julian denies any bruising or bleeding.  Exam reveals  no adenopathy or hepatosplenomegaly.  Hematocrit has improved from 27.1 to 43.2 on oral iron.  Platelet count is 42,000.  Plan: 1.  Labs today:  CBC with diff, ferritin, B12, folate. 2.  Hold aspirin. 3.  Continue oral iron until ferritin 100. 4.  Discuss initiation of steroids for presumed ITP if continued drop in platelet count. 5.  RTC in 1 week for MD assess and labs (CBC with diff).   Lequita Asal, MD  06/25/2016, 10:07 AM

## 2016-06-25 ENCOUNTER — Other Ambulatory Visit: Payer: Self-pay

## 2016-06-25 ENCOUNTER — Encounter: Payer: Self-pay | Admitting: Hematology and Oncology

## 2016-06-25 ENCOUNTER — Inpatient Hospital Stay: Payer: Medicaid Other | Attending: Hematology and Oncology

## 2016-06-25 ENCOUNTER — Inpatient Hospital Stay (HOSPITAL_BASED_OUTPATIENT_CLINIC_OR_DEPARTMENT_OTHER): Payer: Medicaid Other | Admitting: Hematology and Oncology

## 2016-06-25 ENCOUNTER — Other Ambulatory Visit: Payer: Self-pay | Admitting: *Deleted

## 2016-06-25 VITALS — BP 136/93 | HR 56 | Temp 94.3°F | Resp 18 | Wt 184.1 lb

## 2016-06-25 DIAGNOSIS — D509 Iron deficiency anemia, unspecified: Secondary | ICD-10-CM

## 2016-06-25 DIAGNOSIS — Z8041 Family history of malignant neoplasm of ovary: Secondary | ICD-10-CM | POA: Insufficient documentation

## 2016-06-25 DIAGNOSIS — Z801 Family history of malignant neoplasm of trachea, bronchus and lung: Secondary | ICD-10-CM | POA: Insufficient documentation

## 2016-06-25 DIAGNOSIS — D696 Thrombocytopenia, unspecified: Secondary | ICD-10-CM

## 2016-06-25 DIAGNOSIS — Z87828 Personal history of other (healed) physical injury and trauma: Secondary | ICD-10-CM | POA: Diagnosis not present

## 2016-06-25 DIAGNOSIS — Z7982 Long term (current) use of aspirin: Secondary | ICD-10-CM | POA: Insufficient documentation

## 2016-06-25 DIAGNOSIS — D693 Immune thrombocytopenic purpura: Secondary | ICD-10-CM | POA: Insufficient documentation

## 2016-06-25 DIAGNOSIS — D122 Benign neoplasm of ascending colon: Secondary | ICD-10-CM | POA: Insufficient documentation

## 2016-06-25 DIAGNOSIS — Z79899 Other long term (current) drug therapy: Secondary | ICD-10-CM

## 2016-06-25 DIAGNOSIS — Z87891 Personal history of nicotine dependence: Secondary | ICD-10-CM | POA: Diagnosis not present

## 2016-06-25 DIAGNOSIS — Z7952 Long term (current) use of systemic steroids: Secondary | ICD-10-CM | POA: Insufficient documentation

## 2016-06-25 DIAGNOSIS — Z8042 Family history of malignant neoplasm of prostate: Secondary | ICD-10-CM

## 2016-06-25 LAB — CBC WITH DIFFERENTIAL/PLATELET
Basophils Absolute: 0 10*3/uL (ref 0–0.1)
Basophils Relative: 1 %
Eosinophils Absolute: 0.1 10*3/uL (ref 0–0.7)
Eosinophils Relative: 2 %
HCT: 43.2 % (ref 40.0–52.0)
Hemoglobin: 14.1 g/dL (ref 13.0–18.0)
Lymphocytes Relative: 52 %
Lymphs Abs: 2.6 10*3/uL (ref 1.0–3.6)
MCH: 28.8 pg (ref 26.0–34.0)
MCHC: 32.7 g/dL (ref 32.0–36.0)
MCV: 88.1 fL (ref 80.0–100.0)
Monocytes Absolute: 0.7 10*3/uL (ref 0.2–1.0)
Monocytes Relative: 14 %
Neutro Abs: 1.6 10*3/uL (ref 1.4–6.5)
Neutrophils Relative %: 31 %
Platelets: 42 10*3/uL — ABNORMAL LOW (ref 150–440)
RBC: 4.9 MIL/uL (ref 4.40–5.90)
RDW: 22 % — ABNORMAL HIGH (ref 11.5–14.5)
WBC: 5 10*3/uL (ref 3.8–10.6)

## 2016-06-25 LAB — FOLATE: Folate: 21 ng/mL (ref >5.9)

## 2016-06-25 LAB — VITAMIN B12: Vitamin B-12: 568 pg/mL (ref 180–914)

## 2016-06-25 LAB — FERRITIN: Ferritin: 26 ng/mL (ref 24–336)

## 2016-07-01 NOTE — Progress Notes (Signed)
[facility] day:  07/02/2016   Chief Complaint: Julian [name] is a 60 y.o. male with iron deficiency anemia and mild thrombocytopenia who seen for 1 week assessment.  HPI:  The patient was last seen in the hematology clinic on 06/25/2016.  At that time, hematocrit had improved to 43.2 on oral iron.  Ferritin was 26.  Concern was raised for a drop in platelet count from 71,000 to 42,000.  B12 and folate were normal.  During the interim, he has felt alright.  He denies any problems.  He feels hungry today.  He denies any bruising or bleeding.  He has not been taking aspirin or ibuprofen.   No past medical history on file.  Past Surgical History:  Procedure Laterality Date  . CHOLECYSTECTOMY      Family History  Problem Relation Age of Onset  . Hypertension Father   . Diabetes Father   . Lung cancer Brother   . Hypertension Maternal Grandmother   . Ovarian cancer Maternal Grandmother   . Diabetes Maternal Grandmother   . Prostate cancer Brother     Social History:  reports that he has quit smoking. He does not have any smokeless tobacco history on file. He reports that he does not drink alcohol. His drug history is not on file.  He previously smoked 1 pack per day.  He quit smoking this year.  He does not drink alcohol.  He does not take any non prescription drugs.  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  He denies any exposure to radiation or toxins.  He was in the [employer] for a few months.  He lives with his mom.  He has 1 son.   Allergies: No Known Allergies  Current Medications: Current Outpatient Prescriptions  Medication Sig Dispense Refill  . aspirin (ASPIRIN CHILDRENS) 81 MG chewable tablet Chew 81 mg by mouth daily.    Marland Kitchen donepezil (ARICEPT) 10 MG tablet Take 10 mg by mouth at bedtime.    . ferrous sulfate 325 (65 FE) MG tablet Take 325 mg by mouth daily with breakfast.    . gabapentin (NEURONTIN) 100 MG capsule Take 100  mg by mouth 2 (two) times daily.  11  . memantine (NAMENDA) 10 MG tablet Take by mouth.     No current facility-administered medications for this visit.     Review of Systems:  GENERAL:  Feels "alright".  No fevers or sweats.  Weight down 1 pound. PERFORMANCE STATUS (ECOG):  2 HEENT:  Possible glaucoma.  No visual changes, runny nose, sore throat, mouth sores or tenderness. Lungs: No shortness of breath or cough.  No hemoptysis. Cardiac:  No chest pain, palpitations, orthopnea, or PND. GI:  No nausea, vomiting, diarrhea, constipation, melena or hematochezia. GU:  No urgency, frequency, dysuria, or hematuria. Musculoskeletal:  No back pain.  No joint pain.  No muscle tenderness. Extremities:  No pain or swelling. Skin:  No rashes or skin changes.  No bruising. Neuro:  No headache, numbness or weakness, balance or coordination issues. Endocrine:  No diabetes, thyroid issues, hot flashes or night sweats. Psych:  No mood changes, depression or anxiety. Pain:  No focal pain. Review of systems:  All other systems reviewed and found to be negative.  Physical Exam: Blood pressure 113/76, pulse (!) 58, temperature (!) 94.5 F (34.7 C), temperature source Tympanic, resp. rate 18, weight 183 lb 6.8 oz (83.2 kg). GENERAL:  Slightly disheveled odiferous appearing gentleman sitting  comfortably in the exam room in no acute distress. MENTAL STATUS:  Alert and oriented to person, place and time. HEAD:  Short black hair.  Mustache.  Gray sideburns and beard.  Normocephalic, atraumatic, face symmetric, no Cushingoid features. EYES:  Brown eyes.  Pupils equal round and reactive to light and accomodation.  No conjunctivitis or scleral icterus. ENT:  Oropharynx clear without lesion.  Edentulous. Tongue normal. Mucous membranes moist.  RESPIRATORY:  Clear to auscultation without rales, wheezes or rhonchi. CARDIOVASCULAR:  Regular rate and rhythm without murmur, rub or gallop. ABDOMEN:  Soft, non-tender,  with active bowel sounds, and no hepatosplenomegaly.  No masses. SKIN:  No rashes, ulcers or lesions. EXTREMITIES: No edema, no skin discoloration or tenderness.  No palpable cords. LYMPH NODES: No palpable cervical, supraclavicular, axillary or inguinal adenopathy  NEUROLOGICAL: Unremarkable. PSYCH:  Appropriate.   Appointment on 07/02/2016  Component Date Value Ref Range Status  . WBC 07/02/2016 5.0  3.8 - 10.6 K/uL Final  . RBC 07/02/2016 4.73  4.40 - 5.90 MIL/uL Final  . Hemoglobin 07/02/2016 13.9  13.0 - 18.0 g/dL Final  . HCT 07/02/2016 42.1  40.0 - 52.0 % Final  . MCV 07/02/2016 89.0  80.0 - 100.0 fL Final  . MCH 07/02/2016 29.4  26.0 - 34.0 pg Final  . MCHC 07/02/2016 33.1  32.0 - 36.0 g/dL Final  . RDW 07/02/2016 20.3* 11.5 - 14.5 % Final  . Platelets 07/02/2016 PENDING  150 - 400 K/uL Incomplete  . Neutrophils Relative % 07/02/2016 39  % Final  . Lymphocytes Relative 07/02/2016 43  % Final  . Monocytes Relative 07/02/2016 15  % Final  . Eosinophils Relative 07/02/2016 2  % Final  . Basophils Relative 07/02/2016 1  % Final  . Neutro Abs 07/02/2016 2.0  1.4 - 6.5 K/uL Final  . Lymphs Abs 07/02/2016 2.0  1.0 - 3.6 K/uL Final  . Monocytes Absolute 07/02/2016 0.8  0.2 - 1.0 K/uL Final  . Eosinophils Absolute 07/02/2016 0.1  0 - 0.7 K/uL Final  . Basophils Absolute 07/02/2016 0.1  0 - 0.1 K/uL Final    Assessment:  Julian [name] is a 60 y.o. 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  Abdominal ultrasound on 05/11/2016 revealed a normal spleen and liver. Platelet count fluctuates (59,000 to 103,000).   He has iron deficiency anemia.  Diet appears good.  He denies any melena or hematochezia.  He denies any pica. Colonoscopy on 06/03/2014 revealed a tubular adenoma in the ascending colon negative for high grade dysplasia and malignancy.  He declines guaiac cards.  He has been on oral iron x 3 months.    Work-up on  04/10/2016 revealed a hematocrit of 30.6, hemoglobin 9.1, MCV 69, platelets 88,000, WBC 5400 with an ANC of 2400.  Differential was unremarkable.  Negative studies included:  hepatitis B surface antigen, hepatitis B core antibody total, hepatitis C antibody, HIV antibody, ANA, and TSH.  Ferritin was 10 (low) with a TIBC of 457 (high) and a saturation of 43%.  Retic was 3.3%.  B12 and folate were normal on 06/25/2016.  Platelet count is as follows: 59,000 on 03/08/2016, 88,000 on 04/10/2016, 71,000 on 05/28/2016, 42,000 on 06/25/2016, and 36,000 (estimate) on 07/02/2016.  He has a history of head trauma in the 1970s.  Symptomatically, he denies any bruising or bleeding.  Exam reveals no adenopathy or hepatosplenomegaly.  Hematocrit is 42 on oral iron.  He has moderate thrombocytopenia.  Plan:  1.  Labs today:  CBC with diff. 2.  Discuss current platelet count.  Discuss initiation of steroids.  Side effects reviewed.  Discuss PPI for GI prophylaxis. 3.  Begin prednisone 40 mg a day. 4.  Rx:  omeprazole 20 mg a day. 5.  Continue oral iron until ferritin 100. 6.  Obtain prior CBCs from [facility]. 7.  RTC in 1 week for MD assessment and labs (CBC with diff, BMP).   Lequita Asal, MD  07/02/2016, 10:32 AM

## 2016-07-02 ENCOUNTER — Inpatient Hospital Stay (HOSPITAL_BASED_OUTPATIENT_CLINIC_OR_DEPARTMENT_OTHER): Payer: Medicaid Other | Admitting: Hematology and Oncology

## 2016-07-02 ENCOUNTER — Inpatient Hospital Stay: Payer: Medicaid Other

## 2016-07-02 ENCOUNTER — Other Ambulatory Visit: Payer: Self-pay | Admitting: *Deleted

## 2016-07-02 VITALS — BP 113/76 | HR 58 | Temp 94.5°F | Resp 18 | Wt 183.4 lb

## 2016-07-02 DIAGNOSIS — D693 Immune thrombocytopenic purpura: Secondary | ICD-10-CM

## 2016-07-02 DIAGNOSIS — D696 Thrombocytopenia, unspecified: Secondary | ICD-10-CM | POA: Diagnosis not present

## 2016-07-02 DIAGNOSIS — Z7982 Long term (current) use of aspirin: Secondary | ICD-10-CM | POA: Diagnosis not present

## 2016-07-02 DIAGNOSIS — D509 Iron deficiency anemia, unspecified: Secondary | ICD-10-CM | POA: Diagnosis not present

## 2016-07-02 DIAGNOSIS — Z8042 Family history of malignant neoplasm of prostate: Secondary | ICD-10-CM

## 2016-07-02 DIAGNOSIS — Z801 Family history of malignant neoplasm of trachea, bronchus and lung: Secondary | ICD-10-CM

## 2016-07-02 DIAGNOSIS — Z87828 Personal history of other (healed) physical injury and trauma: Secondary | ICD-10-CM

## 2016-07-02 DIAGNOSIS — Z87891 Personal history of nicotine dependence: Secondary | ICD-10-CM | POA: Diagnosis not present

## 2016-07-02 DIAGNOSIS — Z8041 Family history of malignant neoplasm of ovary: Secondary | ICD-10-CM

## 2016-07-02 DIAGNOSIS — Z79899 Other long term (current) drug therapy: Secondary | ICD-10-CM

## 2016-07-02 LAB — CBC WITH DIFFERENTIAL/PLATELET
Basophils Absolute: 0.1 10*3/uL (ref 0–0.1)
Basophils Relative: 1 %
Eosinophils Absolute: 0.1 10*3/uL (ref 0–0.7)
Eosinophils Relative: 2 %
HCT: 42.1 % (ref 40.0–52.0)
Hemoglobin: 13.9 g/dL (ref 13.0–18.0)
Lymphocytes Relative: 43 %
Lymphs Abs: 2 10*3/uL (ref 1.0–3.6)
MCH: 29.4 pg (ref 26.0–34.0)
MCHC: 33.1 g/dL (ref 32.0–36.0)
MCV: 89 fL (ref 80.0–100.0)
Monocytes Absolute: 0.8 10*3/uL (ref 0.2–1.0)
Monocytes Relative: 15 %
Neutro Abs: 2 10*3/uL (ref 1.4–6.5)
Neutrophils Relative %: 39 %
Platelets: 42 10*3/uL — ABNORMAL LOW (ref 150–440)
RBC: 4.73 MIL/uL (ref 4.40–5.90)
RDW: 20.3 % — ABNORMAL HIGH (ref 11.5–14.5)
WBC: 5 10*3/uL (ref 3.8–10.6)

## 2016-07-02 MED ORDER — OMEPRAZOLE 20 MG PO CPDR: 20.0000 mg | DELAYED_RELEASE_CAPSULE | Freq: Every day | ORAL | 1 refills | Status: DC

## 2016-07-02 MED ORDER — PREDNISONE 20 MG PO TABS: 40.0000 mg | ORAL_TABLET | Freq: Every day | ORAL | 1 refills | Status: DC

## 2016-07-07 ENCOUNTER — Encounter: Payer: Self-pay | Admitting: Hematology and Oncology

## 2016-07-08 NOTE — Progress Notes (Signed)
[facility] [facility] day:  07/09/2016   Chief Complaint: Julian [name] is a 60 y.o. male with iron deficiency anemia and immune mediated thrombocytopenic purpura (ITP) who seen for 1 week assessment on prednisone.  HPI:  The patient was last seen in the hematology clinic on 07/02/2016.  At that time, he felt alright.  Hematocrit had improved to 42 on oral iron.  Ferritin was 26.  Platelet count had dropped to an initial estimate of 36,000 (final 42,000).  Decision was made to begin prednisone 40 mg a day for treatment of presumed ITP.  He was also started on omeprazole.  During the interim, he was put on Levaquin and doxycycline for 10 days.  He was seen by ENT 2 weeks ago.  He is breathing better.  He denies any bruising or bleeding.  He is having an office procedure on 08/30/2016.   No past medical history on file.  Past Surgical History:  Procedure Laterality Date  . CHOLECYSTECTOMY      Family History  Problem Relation Age of Onset  . Hypertension Father   . Diabetes Father   . Lung cancer Brother   . Hypertension Maternal Grandmother   . Ovarian cancer Maternal Grandmother   . Diabetes Maternal Grandmother   . Prostate cancer Brother     Social History:  reports that he has quit smoking. He does not have any smokeless tobacco history on file. He reports that he does not drink alcohol. His drug history is not on file.  He previously smoked 1 pack per day.  He quit smoking this year.  He does not drink alcohol.  He does not take any non prescription drugs.  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  He denies any exposure to radiation or toxins.  He was in the [employer] for a few months.  He lives with his mom.  He has 1 son.  He is accompanied by a woman today.  Allergies: No Known Allergies  Current Medications: Current Outpatient Prescriptions  Medication Sig Dispense Refill  . aspirin (ASPIRIN CHILDRENS) 81 MG chewable tablet Chew 81  mg by mouth daily.    Marland Kitchen donepezil (ARICEPT) 10 MG tablet Take 10 mg by mouth at bedtime.    . ferrous sulfate 325 (65 FE) MG tablet Take 325 mg by mouth daily with breakfast.    . gabapentin (NEURONTIN) 100 MG capsule Take 100 mg by mouth 2 (two) times daily.  11  . memantine (NAMENDA) 10 MG tablet Take by mouth.    Marland Kitchen omeprazole (PRILOSEC) 20 MG capsule Take 1 capsule (20 mg total) by mouth daily. 30 capsule 1  . predniSONE (DELTASONE) 20 MG tablet Take 2 tablets (40 mg total) by mouth daily with breakfast. 30 tablet 1   No current facility-administered medications for this visit.     Review of Systems:  GENERAL:  Feels "fine".  No fevers or sweats.  Weight down 2 pounds. PERFORMANCE STATUS (ECOG):  2 HEENT:  Possible glaucoma.  No visual changes, runny nose, sore throat, mouth sores or tenderness. Lungs: No shortness of breath or cough.  No hemoptysis. Cardiac:  No chest pain, palpitations, orthopnea, or PND. GI:  No nausea, vomiting, diarrhea, constipation, melena or hematochezia. GU:  No urgency, frequency, dysuria, or hematuria. Musculoskeletal:  No back pain.  No joint pain.  No muscle tenderness. Extremities:  No pain or swelling. Skin:  No rashes or skin changes.  No bruising. Neuro:  No headache, numbness or weakness, balance or coordination issues. Endocrine:  No diabetes, thyroid issues, hot flashes or night sweats. Psych:  No mood changes, depression or anxiety. Pain:  No focal pain. Review of systems:  All other systems reviewed and found to be negative.  Physical Exam: Blood pressure 119/77, pulse (!) 51, temperature (!) 94.5 F (34.7 C), temperature source Tympanic, resp. rate 18, weight 181 lb 7 oz (82.3 kg). GENERAL:  Slightly disheveled appearing gentleman sitting comfortably in the exam room in no acute distress.  He has a rolling seated walker at his side. MENTAL STATUS:  Alert and oriented to person, place and time. HEAD:  Wearing a blue knit cap.  Short black  hair.  Mustache.  Gray sideburns and beard.  Normocephalic, atraumatic, face symmetric, no Cushingoid features. EYES:  Glasses.  Brown eyes.  Pupils equal round and reactive to light and accomodation.  No conjunctivitis or scleral icterus. ENT:  Oropharynx clear without lesion.  Edentulous. Tongue normal. Mucous membranes moist.  RESPIRATORY:  Clear to auscultation without rales, wheezes or rhonchi. CARDIOVASCULAR:  Regular rate and rhythm without murmur, rub or gallop. ABDOMEN:  Soft, non-tender, with active bowel sounds, and no hepatosplenomegaly.  No masses. SKIN:  No rashes, ulcers or lesions. EXTREMITIES: No edema, no skin discoloration or tenderness.  No palpable cords. LYMPH NODES: No palpable cervical, supraclavicular, axillary or inguinal adenopathy  NEUROLOGICAL: Unremarkable. PSYCH:  Appropriate.   Appointment on 07/09/2016  Component Date Value Ref Range Status  . WBC 07/09/2016 5.8  3.8 - 10.6 K/uL Final  . RBC 07/09/2016 4.75  4.40 - 5.90 MIL/uL Final  . Hemoglobin 07/09/2016 14.1  13.0 - 18.0 g/dL Final  . HCT 07/09/2016 43.0  40.0 - 52.0 % Final  . MCV 07/09/2016 90.4  80.0 - 100.0 fL Final  . MCH 07/09/2016 29.6  26.0 - 34.0 pg Final  . MCHC 07/09/2016 32.8  32.0 - 36.0 g/dL Final  . RDW 07/09/2016 18.6* 11.5 - 14.5 % Final  . Platelets 07/09/2016 PENDING  150 - 400 K/uL Incomplete  . Neutrophils Relative % 07/09/2016 34  % Final  . Neutro Abs 07/09/2016 2.0  1.4 - 6.5 K/uL Final  . Lymphocytes Relative 07/09/2016 52  % Final  . Lymphs Abs 07/09/2016 3.1  1.0 - 3.6 K/uL Final  . Monocytes Relative 07/09/2016 13  % Final  . Monocytes Absolute 07/09/2016 0.7  0.2 - 1.0 K/uL Final  . Eosinophils Relative 07/09/2016 1  % Final  . Eosinophils Absolute 07/09/2016 0.1  0 - 0.7 K/uL Final  . Basophils Relative 07/09/2016 0  % Final  . Basophils Absolute 07/09/2016 0.0  0 - 0.1 K/uL Final  . Sodium 07/09/2016 141  135 - 145 mmol/L Final  . Potassium 07/09/2016 3.8  3.5 -  5.1 mmol/L Final  . Chloride 07/09/2016 101  101 - 111 mmol/L Final  . CO2 07/09/2016 30  22 - 32 mmol/L Final  . Glucose, Bld 07/09/2016 88  65 - 99 mg/dL Final  . BUN 07/09/2016 18  6 - 20 mg/dL Final  . Creatinine, Ser 07/09/2016 0.87  0.61 - 1.24 mg/dL Final  . Calcium 07/09/2016 9.0  8.9 - 10.3 mg/dL Final  . GFR calc non Af Amer 07/09/2016 >60  >60 mL/min Final  . GFR calc Af Amer 07/09/2016 >60  >60 mL/min Final   Comment: (NOTE) The eGFR has been calculated using the CKD EPI equation. This calculation has not been validated in all clinical situations.  eGFR's persistently <60 mL/min signify possible Chronic Kidney Disease.   . Anion gap 07/09/2016 10  5 - 15 Final    Assessment:  Byrne Capek is a 60 y.o. 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  Abdominal ultrasound on 05/11/2016 revealed a normal spleen and liver. Platelet count fluctuates (59,000 to 103,000).   He has iron deficiency anemia.  Diet appears good.  He denies any melena or hematochezia.  He denies any pica. Colonoscopy on 06/03/2014 revealed a tubular adenoma in the ascending colon negative for high grade dysplasia and malignancy.  He declines guaiac cards.  He has been on oral iron x 3 months.    Work-up on 04/10/2016 revealed a hematocrit of 30.6, hemoglobin 9.1, MCV 69, platelets 88,000, WBC 5400 with an ANC of 2400.  Differential was unremarkable.  Negative studies included:  hepatitis B surface antigen, hepatitis B core antibody total, hepatitis C antibody, HIV antibody, ANA, and TSH.  Ferritin was 10 (low) with a TIBC of 457 (high) and a saturation of 43%.  Retic was 3.3%.  B12 and folate were normal on 06/25/2016.  Platelet count is as follows: 59,000 on 03/08/2016, 88,000 on 04/10/2016, 71,000 on 05/28/2016, 42,000 on 06/25/2016, 42,000 on 07/02/2016, and 90,000 on 07/09/2016.  He has a history of head trauma in the 1970s.  He has iron  deficiency on oral iron.  He began prednisone 65m a day on 07/02/2016.  Symptomatically, he denies any bruising or bleeding.  Hematocrit is 43 on oral iron.  Platelet count is 90,000.  Plan: 1.  Labs today:  CBC with diff, BMP. 2.  Decrease prednisone to 30 mg a day. 3.  Continue omeprazole. 4.  Continue oral iron until ferritin 100. 5.  RTC weekly x 3 for CBC. 6.  RTC in 1 month for MD assessment and labs (CBC with diff, ferritin).   MLequita Asal MD  07/09/2016, 11:42 AM

## 2016-07-09 ENCOUNTER — Inpatient Hospital Stay (HOSPITAL_BASED_OUTPATIENT_CLINIC_OR_DEPARTMENT_OTHER): Payer: Medicaid Other | Admitting: Hematology and Oncology

## 2016-07-09 ENCOUNTER — Other Ambulatory Visit: Payer: Self-pay | Admitting: *Deleted

## 2016-07-09 ENCOUNTER — Inpatient Hospital Stay: Payer: Medicaid Other

## 2016-07-09 ENCOUNTER — Telehealth: Payer: Self-pay | Admitting: *Deleted

## 2016-07-09 ENCOUNTER — Other Ambulatory Visit: Payer: Self-pay

## 2016-07-09 VITALS — BP 119/77 | HR 51 | Temp 94.5°F | Resp 18 | Wt 181.4 lb

## 2016-07-09 DIAGNOSIS — D509 Iron deficiency anemia, unspecified: Secondary | ICD-10-CM

## 2016-07-09 DIAGNOSIS — Z79899 Other long term (current) drug therapy: Secondary | ICD-10-CM

## 2016-07-09 DIAGNOSIS — D693 Immune thrombocytopenic purpura: Secondary | ICD-10-CM | POA: Diagnosis not present

## 2016-07-09 DIAGNOSIS — Z7952 Long term (current) use of systemic steroids: Secondary | ICD-10-CM

## 2016-07-09 DIAGNOSIS — Z7982 Long term (current) use of aspirin: Secondary | ICD-10-CM

## 2016-07-09 DIAGNOSIS — D122 Benign neoplasm of ascending colon: Secondary | ICD-10-CM

## 2016-07-09 DIAGNOSIS — Z87828 Personal history of other (healed) physical injury and trauma: Secondary | ICD-10-CM

## 2016-07-09 DIAGNOSIS — Z87891 Personal history of nicotine dependence: Secondary | ICD-10-CM

## 2016-07-09 DIAGNOSIS — Z8042 Family history of malignant neoplasm of prostate: Secondary | ICD-10-CM

## 2016-07-09 DIAGNOSIS — Z801 Family history of malignant neoplasm of trachea, bronchus and lung: Secondary | ICD-10-CM

## 2016-07-09 DIAGNOSIS — Z8041 Family history of malignant neoplasm of ovary: Secondary | ICD-10-CM

## 2016-07-09 LAB — CBC WITH DIFFERENTIAL/PLATELET
Basophils Absolute: 0 10*3/uL (ref 0–0.1)
Basophils Relative: 0 %
Eosinophils Absolute: 0.1 10*3/uL (ref 0–0.7)
Eosinophils Relative: 1 %
HCT: 43 % (ref 40.0–52.0)
Hemoglobin: 14.1 g/dL (ref 13.0–18.0)
Lymphocytes Relative: 52 %
Lymphs Abs: 3.1 10*3/uL (ref 1.0–3.6)
MCH: 29.6 pg (ref 26.0–34.0)
MCHC: 32.8 g/dL (ref 32.0–36.0)
MCV: 90.4 fL (ref 80.0–100.0)
Monocytes Absolute: 0.7 10*3/uL (ref 0.2–1.0)
Monocytes Relative: 13 %
Neutro Abs: 2 10*3/uL (ref 1.4–6.5)
Neutrophils Relative %: 34 %
Platelets: 90 10*3/uL — ABNORMAL LOW (ref 150–400)
RBC: 4.75 MIL/uL (ref 4.40–5.90)
RDW: 18.6 % — ABNORMAL HIGH (ref 11.5–14.5)
WBC: 5.8 10*3/uL (ref 3.8–10.6)

## 2016-07-09 LAB — BASIC METABOLIC PANEL
Anion gap: 10 (ref 5–15)
BUN: 18 mg/dL (ref 6–20)
CO2: 30 mmol/L (ref 22–32)
Calcium: 9 mg/dL (ref 8.9–10.3)
Chloride: 101 mmol/L (ref 101–111)
Creatinine, Ser: 0.87 mg/dL (ref 0.61–1.24)
GFR calc Af Amer: >60 mL/min (ref >60)
GFR calc non Af Amer: >60 mL/min (ref >60)
Glucose, Bld: 88 mg/dL (ref 65–99)
Potassium: 3.8 mmol/L (ref 3.5–5.1)
Sodium: 141 mmol/L (ref 135–145)

## 2016-07-09 MED ORDER — PREDNISONE 10 MG PO TABS: 30.0000 mg | ORAL_TABLET | Freq: Every day | ORAL | 0 refills | Status: DC

## 2016-07-09 NOTE — Telephone Encounter (Signed)
Called the cousin Julian [name] to tell her the plt are better at 90.he can decrease to 30 mg prednisone and the cousin had asked me this am that if prednisone dec. Then she would need a new rx. It is hard to cut the 20 mg in half. Sent in electronic rx for prednisone 10 mg tablets and pt should take 3 a day with food.  Julian [name] wants to have pt check his blood counts once a week for 3 weeks and on 4th week  Labs and see md.  Julian [name] is agreeable to the plan and we have also mailed appt paper to pt's house and she says she gets all the mail for him so it is ok.

## 2016-07-12 ENCOUNTER — Other Ambulatory Visit: Payer: Self-pay | Admitting: *Deleted

## 2016-07-12 DIAGNOSIS — D693 Immune thrombocytopenic purpura: Secondary | ICD-10-CM

## 2016-07-16 ENCOUNTER — Inpatient Hospital Stay: Payer: Medicaid Other

## 2016-07-16 ENCOUNTER — Telehealth: Payer: Self-pay | Admitting: *Deleted

## 2016-07-16 DIAGNOSIS — D509 Iron deficiency anemia, unspecified: Secondary | ICD-10-CM | POA: Diagnosis not present

## 2016-07-16 DIAGNOSIS — D693 Immune thrombocytopenic purpura: Secondary | ICD-10-CM

## 2016-07-16 LAB — CBC WITH DIFFERENTIAL/PLATELET
Basophils Absolute: 0 10*3/uL (ref 0–0.1)
Basophils Relative: 1 %
Eosinophils Absolute: 0 10*3/uL (ref 0–0.7)
Eosinophils Relative: 1 %
HCT: 40.7 % (ref 40.0–52.0)
Hemoglobin: 13.5 g/dL (ref 13.0–18.0)
Lymphocytes Relative: 19 %
Lymphs Abs: 1.5 10*3/uL (ref 1.0–3.6)
MCH: 30.3 pg (ref 26.0–34.0)
MCHC: 33.2 g/dL (ref 32.0–36.0)
MCV: 91.2 fL (ref 80.0–100.0)
Monocytes Absolute: 0.7 10*3/uL (ref 0.2–1.0)
Monocytes Relative: 10 %
Neutro Abs: 5.4 10*3/uL (ref 1.4–6.5)
Neutrophils Relative %: 69 %
Platelets: 51 10*3/uL — ABNORMAL LOW (ref 150–440)
RBC: 4.46 MIL/uL (ref 4.40–5.90)
RDW: 17.7 % — ABNORMAL HIGH (ref 11.5–14.5)
WBC: 7.8 10*3/uL (ref 3.8–10.6)

## 2016-07-16 NOTE — Telephone Encounter (Signed)
-----   Message from Lequita Asal, MD sent at 07/16/2016  2:29 PM EDT ----- Regarding: Please call patient  What dose of prednisone is he on?  Because of drop in platelet count, maintain current dose. Check counts next week.  M    ----- Message ----- From: Interface, Lab In [facility] Sent: 07/16/2016  12:14 PM To: Lequita Asal, MD

## 2016-07-16 NOTE — Telephone Encounter (Signed)
Called patient's caretaker, Denyce Robert and verified patient is taking 30 mg Prednisone daily.  Instructed caretaker to continue current dosage per VO of MD.  Will recheck labs again next week.  Verbalized understanding.

## 2016-07-18 ENCOUNTER — Telehealth: Payer: Self-pay | Admitting: *Deleted

## 2016-07-18 NOTE — Telephone Encounter (Signed)
Patient called earlier to inquire about his 24 hr urine results.  Called back to let him know that MD would like to wait until she has PET scan results in order for her to make a determination regarding his diagnosis.  He has an appointment with her on 07-24-16, the day after the PET to go over his results.  Patient verbalized understanding and is in agreement with her plan.

## 2016-07-18 NOTE — Telephone Encounter (Signed)
Returned call to [employer] Julian [name] unavailable. Told her we would try to return call tomorrow 07/19/2016

## 2016-07-23 ENCOUNTER — Telehealth: Payer: Self-pay | Admitting: *Deleted

## 2016-07-23 ENCOUNTER — Inpatient Hospital Stay: Payer: Medicaid Other | Attending: Hematology and Oncology

## 2016-07-23 DIAGNOSIS — Z79899 Other long term (current) drug therapy: Secondary | ICD-10-CM | POA: Insufficient documentation

## 2016-07-23 DIAGNOSIS — Z87891 Personal history of nicotine dependence: Secondary | ICD-10-CM | POA: Diagnosis not present

## 2016-07-23 DIAGNOSIS — Z7982 Long term (current) use of aspirin: Secondary | ICD-10-CM | POA: Diagnosis not present

## 2016-07-23 DIAGNOSIS — Z7952 Long term (current) use of systemic steroids: Secondary | ICD-10-CM | POA: Insufficient documentation

## 2016-07-23 DIAGNOSIS — Z86010 Personal history of colonic polyps: Secondary | ICD-10-CM | POA: Insufficient documentation

## 2016-07-23 DIAGNOSIS — D509 Iron deficiency anemia, unspecified: Secondary | ICD-10-CM | POA: Diagnosis present

## 2016-07-23 DIAGNOSIS — Z87828 Personal history of other (healed) physical injury and trauma: Secondary | ICD-10-CM | POA: Diagnosis not present

## 2016-07-23 DIAGNOSIS — D693 Immune thrombocytopenic purpura: Secondary | ICD-10-CM | POA: Diagnosis not present

## 2016-07-23 LAB — CBC WITH DIFFERENTIAL/PLATELET
Basophils Absolute: 0 10*3/uL (ref 0–0.1)
Basophils Relative: 0 %
Eosinophils Absolute: 0 10*3/uL (ref 0–0.7)
Eosinophils Relative: 0 %
HCT: 41.6 % (ref 40.0–52.0)
Hemoglobin: 14 g/dL (ref 13.0–18.0)
Lymphocytes Relative: 25 %
Lymphs Abs: 1.7 10*3/uL (ref 1.0–3.6)
MCH: 31.3 pg (ref 26.0–34.0)
MCHC: 33.7 g/dL (ref 32.0–36.0)
MCV: 92.9 fL (ref 80.0–100.0)
Monocytes Absolute: 0.6 10*3/uL (ref 0.2–1.0)
Monocytes Relative: 8 %
Neutro Abs: 4.6 10*3/uL (ref 1.4–6.5)
Neutrophils Relative %: 67 %
Platelets: 46 10*3/uL — ABNORMAL LOW (ref 150–440)
RBC: 4.48 MIL/uL (ref 4.40–5.90)
RDW: 17.7 % — ABNORMAL HIGH (ref 11.5–14.5)
WBC: 7 10*3/uL (ref 3.8–10.6)

## 2016-07-23 NOTE — Telephone Encounter (Signed)
Called all numbers in chart. One number no voicemail set up, next # no longer working number and the last phone # no answer and no way to leave message. Will try later to see if someone at home to speak to about dose of prednsisone pt is on.  It shouldbe 30 mg and want to make sure taking each day.  plt count is lower on last count

## 2016-07-25 ENCOUNTER — Telehealth: Payer: Self-pay | Admitting: *Deleted

## 2016-07-25 NOTE — Telephone Encounter (Signed)
Called pt's house and spoke to [facility] and gave her plt count 42.  Wanted to check to make sure pt still on prednisone 30 mg daily and she states yes.  They will be back in office Monday to recheck counts and we will continue to monitor and will call if changes needs to be made with prednisone. Family agreeable.

## 2016-07-30 ENCOUNTER — Inpatient Hospital Stay: Payer: Medicaid Other

## 2016-07-30 DIAGNOSIS — D509 Iron deficiency anemia, unspecified: Secondary | ICD-10-CM | POA: Diagnosis not present

## 2016-07-30 DIAGNOSIS — D693 Immune thrombocytopenic purpura: Secondary | ICD-10-CM

## 2016-07-30 LAB — CBC WITH DIFFERENTIAL/PLATELET
Basophils Absolute: 0 10*3/uL (ref 0–0.1)
Basophils Relative: 0 %
Eosinophils Absolute: 0 10*3/uL (ref 0–0.7)
Eosinophils Relative: 0 %
HCT: 41.7 % (ref 40.0–52.0)
Hemoglobin: 13.7 g/dL (ref 13.0–18.0)
Lymphocytes Relative: 29 %
Lymphs Abs: 2.2 10*3/uL (ref 1.0–3.6)
MCH: 31.3 pg (ref 26.0–34.0)
MCHC: 32.9 g/dL (ref 32.0–36.0)
MCV: 95.2 fL (ref 80.0–100.0)
Monocytes Absolute: 0.8 10*3/uL (ref 0.2–1.0)
Monocytes Relative: 10 %
Neutro Abs: 4.6 10*3/uL (ref 1.4–6.5)
Neutrophils Relative %: 61 %
Platelets: 74 10*3/uL — ABNORMAL LOW (ref 150–440)
RBC: 4.38 MIL/uL — ABNORMAL LOW (ref 4.40–5.90)
RDW: 16.7 % — ABNORMAL HIGH (ref 11.5–14.5)
WBC: 7.6 10*3/uL (ref 3.8–10.6)

## 2016-08-05 ENCOUNTER — Encounter: Payer: Self-pay | Admitting: Hematology and Oncology

## 2016-08-05 NOTE — Progress Notes (Signed)
[facility] day:  08/06/2016   Chief Complaint: Julian [name] is a 59 y.o. male with iron deficiency anemia and immune mediated thrombocytopenic purpura (ITP) who seen for 1 month assessment on a prednisone taper.  HPI:  The patient was last seen in the hematology clinic on 07/09/2016.  At that time, he was doing well without bruising or bleeding.  Platelet count was 90,000.  His prednisone was tapered to 30 mg a day.  He has had weekly platelet counts.  Platelet count was 51,000 on 07/16/2016, 46,000 on 07/23/2016, and 74,000 on 07/30/2016.  We have had some difficulty contacting the patient to review his weekly platelet counts and planned taper.  Symptomatically, he has done "pretty good".  He denies any bruising or bleeding. He has been taking prednisone 30 mg a day. He is taking oral iron twice a day.  No past medical history on file.  Past Surgical History:  Procedure Laterality Date  . CHOLECYSTECTOMY      Family History  Problem Relation Age of Onset  . Hypertension Father   . Diabetes Father   . Lung cancer Brother   . Hypertension Maternal Grandmother   . Ovarian cancer Maternal Grandmother   . Diabetes Maternal Grandmother   . Prostate cancer Brother     Social History:  reports that he has quit smoking. He does not have any smokeless tobacco history on file. He reports that he does not drink alcohol. His drug history is not on file.  He previously smoked 1 pack per day.  He quit smoking this year.  He does not drink alcohol.  He does not take any non prescription drugs.  He previously worked in the [employer].  He denies any exposure to radiation or toxins.  He was in the [employer] for a few months.  He lives with his mom.  He has 1 son.  Hiis cousin, Denyce Robert, assists with his care.  Her contact numbers are: cell: [phone] and home: [phone].  He is accompanied by his cousin, Denyce Robert.    Allergies: No Known  Allergies  Current Medications: Current Outpatient Prescriptions  Medication Sig Dispense Refill  . donepezil (ARICEPT) 10 MG tablet Take 10 mg by mouth at bedtime.    . ferrous sulfate 325 (65 FE) MG tablet Take 325 mg by mouth daily with breakfast.    . gabapentin (NEURONTIN) 100 MG capsule Take 100 mg by mouth 2 (two) times daily.  11  . memantine (NAMENDA) 10 MG tablet Take by mouth.    Marland Kitchen omeprazole (PRILOSEC) 20 MG capsule Take 1 capsule (20 mg total) by mouth daily. 30 capsule 1  . predniSONE (DELTASONE) 10 MG tablet Take 3 tablets (30 mg total) by mouth daily with breakfast. 90 tablet 0  . predniSONE (DELTASONE) 20 MG tablet TAKE 2 TABLETS (40 MG TOTAL) BY MOUTH DAILY WITH BREAKFAST.  1  . aspirin (ASPIRIN CHILDRENS) 81 MG chewable tablet Chew 81 mg by mouth daily.     No current facility-administered medications for this visit.     Review of Systems:  GENERAL:  Feels "pretty good".  No fevers or sweats.  Weight up 5 pounds. PERFORMANCE STATUS (ECOG):  2 HEENT:  Possible glaucoma.  No visual changes, runny nose, sore throat, mouth sores or tenderness. Lungs: No shortness of breath or cough.  No hemoptysis. Cardiac:  No chest pain, palpitations, orthopnea, or PND. GI:  No nausea, vomiting, diarrhea, constipation, melena  or hematochezia. GU:  No urgency, frequency, dysuria, or hematuria. Musculoskeletal:  No back pain.  No joint pain.  No muscle tenderness. Extremities:  No pain or swelling. Skin:  No rashes or skin changes.  No bruising. Neuro:  No headache, numbness or weakness, balance or coordination issues. Endocrine:  No diabetes, thyroid issues, hot flashes or night sweats. Psych:  No mood changes, depression or anxiety. Pain:  No focal pain. Review of systems:  All other systems reviewed and found to be negative.  Physical Exam: Blood pressure 117/80, pulse 70, temperature (!) 95.9 F (35.5 C), temperature source Tympanic, resp. rate 18, weight 186 lb 15.2 oz (84.8  kg). GENERAL:  Slightly disheveled foul smelling gentleman sitting comfortably in the exam room in no acute distress.  He has a rolling seated walker at his side. MENTAL STATUS:  Alert and oriented to person, place and time. HEAD:  Wearing a black cap.  Short black hair.  Mustache.  Gray sideburns and beard.  Normocephalic, atraumatic, face symmetric, no Cushingoid features. EYES:  Glasses.  Brown eyes.  Pupils equal round and reactive to light and accomodation.  No conjunctivitis or scleral icterus. ENT:  Oropharynx clear without lesion.  Edentulous. Tongue normal. Mucous membranes moist.  RESPIRATORY:  Clear to auscultation without rales, wheezes or rhonchi. CARDIOVASCULAR:  Regular rate and rhythm without murmur, rub or gallop. ABDOMEN:  Soft, non-tender, with active bowel sounds, and no hepatosplenomegaly.  No masses. SKIN:  No rashes, ulcers or lesions. EXTREMITIES: No edema, no skin discoloration or tenderness.  No palpable cords. LYMPH NODES: No palpable cervical, supraclavicular, axillary or inguinal adenopathy  NEUROLOGICAL: Unremarkable. PSYCH:  Appropriate.   No visits with results within 3 Day(s) from this visit.  Latest known visit with results is:  Appointment on 07/30/2016  Component Date Value Ref Range Status  . WBC 07/30/2016 7.6  3.8 - 10.6 K/uL Final  . RBC 07/30/2016 4.38* 4.40 - 5.90 MIL/uL Final  . Hemoglobin 07/30/2016 13.7  13.0 - 18.0 g/dL Final  . HCT 07/30/2016 41.7  40.0 - 52.0 % Final  . MCV 07/30/2016 95.2  80.0 - 100.0 fL Final  . MCH 07/30/2016 31.3  26.0 - 34.0 pg Final  . MCHC 07/30/2016 32.9  32.0 - 36.0 g/dL Final  . RDW 07/30/2016 16.7* 11.5 - 14.5 % Final  . Platelets 07/30/2016 74* 150 - 440 K/uL Final   Comment: PLATELET COUNT CONFIRMED BY SMEAR RESULT REPEATED AND VERIFIED   . Neutrophils Relative % 07/30/2016 61  % Final  . Neutro Abs 07/30/2016 4.6  1.4 - 6.5 K/uL Final  . Lymphocytes Relative 07/30/2016 29  % Final  . Lymphs Abs  07/30/2016 2.2  1.0 - 3.6 K/uL Final  . Monocytes Relative 07/30/2016 10  % Final  . Monocytes Absolute 07/30/2016 0.8  0.2 - 1.0 K/uL Final  . Eosinophils Relative 07/30/2016 0  % Final  . Eosinophils Absolute 07/30/2016 0.0  0 - 0.7 K/uL Final  . Basophils Relative 07/30/2016 0  % Final  . Basophils Absolute 07/30/2016 0.0  0 - 0.1 K/uL Final    Assessment:  Julian [name] is a 60 y.o. 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  Abdominal ultrasound on 05/11/2016 revealed a normal spleen and liver. Platelet count fluctuates (59,000 to 103,000).   He has iron deficiency anemia.  Diet appears good.  He denies any melena or hematochezia.  He denies any pica. Colonoscopy on 06/03/2014 revealed a tubular  adenoma in the ascending colon negative for high grade dysplasia and malignancy.  He declines guaiac cards.  He has been on oral iron x 3 months.    Work-up on 04/10/2016 revealed a hematocrit of 30.6, hemoglobin 9.1, MCV 69, platelets 88,000, WBC 5400 with an ANC of 2400.  Differential was unremarkable.  Negative studies included:  hepatitis B surface antigen, hepatitis B core antibody total, hepatitis C antibody, HIV antibody, ANA, and TSH.  Ferritin was 10 (low) with a TIBC of 457 (high) and a saturation of 43%.  Retic was 3.3%.  B12 and folate were normal on 06/25/2016.  Platelet count is as follows: 59,000 on 03/08/2016, 88,000 on 04/10/2016, 71,000 on 05/28/2016, 42,000 on 06/25/2016, 42,000 on 07/02/2016, 90,000 on 07/09/2016, 51,000 on 07/16/2016, 46,000 on 07/23/2016, 74,000 on 07/30/2016, and 57,000 on 08/06/2016.  He has a history of head trauma in the 1970s.  He has iron deficiency on oral iron BID.  He denies any melena or hematochezia.  He began prednisone 40mg  a day on 07/02/2016.  He is currently on prednisone 30 mg a day.  Symptomatically, he denies any bruising or bleeding.  Hematocrit is 43 on oral iron.  Platelet  count is 57,000.  Ferritin is 29.  Plan: 1.  Labs today:  CBC with diff, ferritin. 2.  Decrease prednisone to 30 mg alternating with 20 mg a day. 3.  Continue omeprazole. 4.  Continue oral iron until ferritin 100. 5.  RTC weekly x 3 for CBC. 6.  RTC in 1 month for MD assessment and labs (CBC with diff).   Lequita Asal, MD  08/06/2016, 12:12 PM

## 2016-08-06 ENCOUNTER — Inpatient Hospital Stay: Payer: Medicaid Other

## 2016-08-06 ENCOUNTER — Inpatient Hospital Stay (HOSPITAL_BASED_OUTPATIENT_CLINIC_OR_DEPARTMENT_OTHER): Payer: Medicaid Other | Admitting: Hematology and Oncology

## 2016-08-06 VITALS — BP 117/80 | HR 70 | Temp 95.9°F | Resp 18 | Wt 186.9 lb

## 2016-08-06 DIAGNOSIS — D693 Immune thrombocytopenic purpura: Secondary | ICD-10-CM

## 2016-08-06 DIAGNOSIS — D509 Iron deficiency anemia, unspecified: Secondary | ICD-10-CM

## 2016-08-06 DIAGNOSIS — Z86010 Personal history of colonic polyps: Secondary | ICD-10-CM

## 2016-08-06 DIAGNOSIS — Z7952 Long term (current) use of systemic steroids: Secondary | ICD-10-CM

## 2016-08-06 DIAGNOSIS — Z87828 Personal history of other (healed) physical injury and trauma: Secondary | ICD-10-CM

## 2016-08-06 DIAGNOSIS — Z79899 Other long term (current) drug therapy: Secondary | ICD-10-CM

## 2016-08-06 DIAGNOSIS — Z87891 Personal history of nicotine dependence: Secondary | ICD-10-CM | POA: Diagnosis not present

## 2016-08-06 DIAGNOSIS — Z7982 Long term (current) use of aspirin: Secondary | ICD-10-CM

## 2016-08-06 LAB — CBC WITH DIFFERENTIAL/PLATELET
Basophils Absolute: 0.1 10*3/uL (ref 0–0.1)
Basophils Relative: 0 %
Eosinophils Absolute: 0 10*3/uL (ref 0–0.7)
Eosinophils Relative: 0 %
HCT: 41.3 % (ref 40.0–52.0)
Hemoglobin: 13.6 g/dL (ref 13.0–18.0)
Lymphocytes Relative: 20 %
Lymphs Abs: 2.6 10*3/uL (ref 1.0–3.6)
MCH: 31.5 pg (ref 26.0–34.0)
MCHC: 33 g/dL (ref 32.0–36.0)
MCV: 95.5 fL (ref 80.0–100.0)
Monocytes Absolute: 1.7 10*3/uL — ABNORMAL HIGH (ref 0.2–1.0)
Monocytes Relative: 13 %
Neutro Abs: 8.8 10*3/uL — ABNORMAL HIGH (ref 1.4–6.5)
Neutrophils Relative %: 67 %
Platelets: 57 10*3/uL — ABNORMAL LOW (ref 150–400)
RBC: 4.32 MIL/uL — ABNORMAL LOW (ref 4.40–5.90)
RDW: 15.5 % — ABNORMAL HIGH (ref 11.5–14.5)
WBC: 13.1 10*3/uL — ABNORMAL HIGH (ref 3.8–10.6)

## 2016-08-06 LAB — FERRITIN: Ferritin: 29 ng/mL (ref 24–336)

## 2016-08-13 ENCOUNTER — Inpatient Hospital Stay: Payer: Medicaid Other

## 2016-08-13 ENCOUNTER — Telehealth: Payer: Self-pay | Admitting: *Deleted

## 2016-08-13 DIAGNOSIS — D509 Iron deficiency anemia, unspecified: Secondary | ICD-10-CM | POA: Diagnosis not present

## 2016-08-13 DIAGNOSIS — D693 Immune thrombocytopenic purpura: Secondary | ICD-10-CM

## 2016-08-13 LAB — CBC WITH DIFFERENTIAL/PLATELET
Basophils Absolute: 0 10*3/uL (ref 0–0.1)
Basophils Relative: 1 %
Eosinophils Absolute: 0.1 10*3/uL (ref 0–0.7)
Eosinophils Relative: 1 %
HCT: 45.6 % (ref 40.0–52.0)
Hemoglobin: 15.1 g/dL (ref 13.0–18.0)
Lymphocytes Relative: 39 %
Lymphs Abs: 1.8 10*3/uL (ref 1.0–3.6)
MCH: 32.4 pg (ref 26.0–34.0)
MCHC: 33.1 g/dL (ref 32.0–36.0)
MCV: 98 fL (ref 80.0–100.0)
Monocytes Absolute: 0.7 10*3/uL (ref 0.2–1.0)
Monocytes Relative: 15 %
Neutro Abs: 2.1 10*3/uL (ref 1.4–6.5)
Neutrophils Relative %: 44 %
Platelets: 53 10*3/uL — ABNORMAL LOW (ref 150–400)
RBC: 4.65 MIL/uL (ref 4.40–5.90)
RDW: 15 % — ABNORMAL HIGH (ref 11.5–14.5)
WBC: 4.7 10*3/uL (ref 3.8–10.6)

## 2016-08-13 NOTE — Telephone Encounter (Signed)
-----   Message from Lequita Asal, MD sent at 08/13/2016  1:38 PM EST ----- Regarding: Please call patient  Please call patient's cousin Denyce Robert (H:  [phone] or cell: [phone]) who helps with his medications.  He should be on prednisone 30 mg alternating with 20 mg.  OK to decrease to 20 mg a day.  Continue weekly CBC   M  ----- Message ----- From: Interface, Lab In [place] Sent: 08/13/2016  10:46 AM To: Lequita Asal, MD

## 2016-08-13 NOTE — Telephone Encounter (Signed)
Called Thayer Headings to inform her that patient should change patient's prednisone to 20 mg daily.  We will continue to monitor weekly CBC.  Verbalized understanding.

## 2016-08-20 ENCOUNTER — Inpatient Hospital Stay: Payer: Medicaid Other | Attending: Internal Medicine

## 2016-08-20 ENCOUNTER — Telehealth: Payer: Self-pay | Admitting: *Deleted

## 2016-08-20 DIAGNOSIS — Z9049 Acquired absence of other specified parts of digestive tract: Secondary | ICD-10-CM | POA: Insufficient documentation

## 2016-08-20 DIAGNOSIS — Z8042 Family history of malignant neoplasm of prostate: Secondary | ICD-10-CM | POA: Diagnosis not present

## 2016-08-20 DIAGNOSIS — D696 Thrombocytopenia, unspecified: Secondary | ICD-10-CM | POA: Insufficient documentation

## 2016-08-20 DIAGNOSIS — D509 Iron deficiency anemia, unspecified: Secondary | ICD-10-CM | POA: Insufficient documentation

## 2016-08-20 DIAGNOSIS — Z8041 Family history of malignant neoplasm of ovary: Secondary | ICD-10-CM | POA: Insufficient documentation

## 2016-08-20 DIAGNOSIS — Z86010 Personal history of colonic polyps: Secondary | ICD-10-CM | POA: Diagnosis not present

## 2016-08-20 DIAGNOSIS — D693 Immune thrombocytopenic purpura: Secondary | ICD-10-CM

## 2016-08-20 DIAGNOSIS — Z7952 Long term (current) use of systemic steroids: Secondary | ICD-10-CM | POA: Insufficient documentation

## 2016-08-20 DIAGNOSIS — Z79899 Other long term (current) drug therapy: Secondary | ICD-10-CM | POA: Diagnosis not present

## 2016-08-20 DIAGNOSIS — Z87828 Personal history of other (healed) physical injury and trauma: Secondary | ICD-10-CM | POA: Diagnosis not present

## 2016-08-20 DIAGNOSIS — Z87891 Personal history of nicotine dependence: Secondary | ICD-10-CM | POA: Diagnosis not present

## 2016-08-20 DIAGNOSIS — Z7982 Long term (current) use of aspirin: Secondary | ICD-10-CM | POA: Diagnosis not present

## 2016-08-20 DIAGNOSIS — Z800 Family history of malignant neoplasm of digestive organs: Secondary | ICD-10-CM | POA: Diagnosis not present

## 2016-08-20 LAB — CBC WITH DIFFERENTIAL/PLATELET
Basophils Absolute: 0 10*3/uL (ref 0–0.1)
Basophils Relative: 1 %
Eosinophils Absolute: 0 10*3/uL (ref 0–0.7)
Eosinophils Relative: 0 %
HCT: 44 % (ref 40.0–52.0)
Hemoglobin: 14.6 g/dL (ref 13.0–18.0)
Lymphocytes Relative: 18 %
Lymphs Abs: 1 10*3/uL (ref 1.0–3.6)
MCH: 32.5 pg (ref 26.0–34.0)
MCHC: 33.3 g/dL (ref 32.0–36.0)
MCV: 97.7 fL (ref 80.0–100.0)
Monocytes Absolute: 0.5 10*3/uL (ref 0.2–1.0)
Monocytes Relative: 8 %
Neutro Abs: 4.2 10*3/uL (ref 1.4–6.5)
Neutrophils Relative %: 73 %
Platelets: 59 10*3/uL — ABNORMAL LOW (ref 150–400)
RBC: 4.5 MIL/uL (ref 4.40–5.90)
RDW: 14.2 % (ref 11.5–14.5)
WBC: 5.8 10*3/uL (ref 3.8–10.6)

## 2016-08-20 NOTE — Telephone Encounter (Signed)
Called Caregiver Thayer Headings at [phone] and informed her to change patients prednisone to 20 mg daily alternating to 10 mg every other day, voiced understanding.

## 2016-08-27 ENCOUNTER — Telehealth: Payer: Self-pay | Admitting: *Deleted

## 2016-08-27 ENCOUNTER — Inpatient Hospital Stay: Payer: Medicaid Other

## 2016-08-27 DIAGNOSIS — D509 Iron deficiency anemia, unspecified: Secondary | ICD-10-CM | POA: Diagnosis not present

## 2016-08-27 DIAGNOSIS — D693 Immune thrombocytopenic purpura: Secondary | ICD-10-CM

## 2016-08-27 LAB — CBC WITH DIFFERENTIAL/PLATELET
Basophils Absolute: 0 10*3/uL (ref 0–0.1)
Basophils Relative: 1 %
Eosinophils Absolute: 0.1 10*3/uL (ref 0–0.7)
Eosinophils Relative: 1 %
HCT: 44.5 % (ref 40.0–52.0)
Hemoglobin: 14.9 g/dL (ref 13.0–18.0)
Lymphocytes Relative: 34 %
Lymphs Abs: 2.8 10*3/uL (ref 1.0–3.6)
MCH: 32.8 pg (ref 26.0–34.0)
MCHC: 33.5 g/dL (ref 32.0–36.0)
MCV: 97.8 fL (ref 80.0–100.0)
Monocytes Absolute: 1 10*3/uL (ref 0.2–1.0)
Monocytes Relative: 13 %
Neutro Abs: 4.3 10*3/uL (ref 1.4–6.5)
Neutrophils Relative %: 53 %
Platelets: 68 10*3/uL — ABNORMAL LOW (ref 150–440)
RBC: 4.54 MIL/uL (ref 4.40–5.90)
RDW: 14.2 % (ref 11.5–14.5)
WBC: 8.2 10*3/uL (ref 3.8–10.6)

## 2016-08-27 NOTE — Telephone Encounter (Signed)
Attempted to call patient.  Line busy.  Will try again in the morning.

## 2016-08-27 NOTE — Telephone Encounter (Signed)
-----   Message from Lequita Asal, MD sent at 08/27/2016 12:23 PM EST ----- Regarding: Please call patient  Patient should be on prednisone 20 mg alternating with 10 mg a day.  If so, decrease to 10 mg a day.  Check CBC in 1 week.  M  ----- Message ----- From: Interface, Lab In [place] Sent: 08/27/2016  12:03 PM To: Lequita Asal, MD

## 2016-08-28 ENCOUNTER — Telehealth: Payer: Self-pay | Admitting: *Deleted

## 2016-08-28 NOTE — Telephone Encounter (Signed)
Called Thayer Headings (Caretaker) to inquire as to how patient is currently taking his prednisone.  She states he is alternating 10 mg one day and 20 mg the next.  Advised her per VO from MD to decrease prednisone to 10 mg daily.  Patient has appointment in one week to have lab repeated (CBC).  Verbalized understanding.

## 2016-08-28 NOTE — Telephone Encounter (Signed)
-----   Message from Lequita Asal, MD sent at 08/27/2016 12:23 PM EST ----- Regarding: Please call patient  Patient should be on prednisone 20 mg alternating with 10 mg a day.  If so, decrease to 10 mg a day.  Check CBC in 1 week.  M  ----- Message ----- From: Interface, Lab In [place] Sent: 08/27/2016  12:03 PM To: Lequita Asal, MD

## 2016-09-02 ENCOUNTER — Encounter: Payer: Self-pay | Admitting: Hematology and Oncology

## 2016-09-02 NOTE — Progress Notes (Signed)
[facility] day:  09/03/2016   Chief Complaint: Julian [name] is a 60 y.o. male with iron deficiency anemia and immune mediated thrombocytopenic purpura (ITP) who seen for 1 month assessment on a prednisone taper.  HPI:  The patient was last seen in the hematology clinic on 08/06/2016.  At that time, he denied any complaint.  Platelet count was 57,000.  His prednisone was tapered to 30 mg alternating with 20 mg a day.  He has had weekly platelet counts.  Platelet count was 53,000 on 08/13/2016, 59,000 on 08/20/2016, and 68,000 on 08/27/2016.    Prednisone was decreased to 20 mg a day on 08/13/2016, 20 mg a day alternating with 10 mg a day on 08/20/2016, and 10 mg a day on 08/28/2016.  Symptomatically, he notes no problems.  He denies any bruising or bleeding.   No past medical history on file.  Past Surgical History:  Procedure Laterality Date  . CHOLECYSTECTOMY      Family History  Problem Relation Age of Onset  . Hypertension Father   . Diabetes Father   . Lung cancer Brother   . Hypertension Maternal Grandmother   . Ovarian cancer Maternal Grandmother   . Diabetes Maternal Grandmother   . Prostate cancer Brother     Social History:  reports that he has quit smoking. He does not have any smokeless tobacco history on file. He reports that he does not drink alcohol. His drug history is not on file.  He previously smoked 1 pack per day.  He quit smoking this year.  He does not drink alcohol.  He does not take any non prescription drugs.  He previously worked in the [employer].  He denies any exposure to radiation or toxins.  He was in the [employer] for a few months.  He lives with his mom.  He has 1 son.  Hiis cousin, Denyce Robert, assists with his care.  Her contact numbers are: cell: [phone] and home: [phone].  He is accompanied by his cousin, Denyce Robert.    Allergies: No Known Allergies  Current Medications: Current  Outpatient Prescriptions  Medication Sig Dispense Refill  . aspirin (ASPIRIN CHILDRENS) 81 MG chewable tablet Chew 81 mg by mouth daily.    Marland Kitchen donepezil (ARICEPT) 10 MG tablet Take 10 mg by mouth at bedtime.    . ferrous sulfate 325 (65 FE) MG tablet Take 325 mg by mouth daily with breakfast.    . gabapentin (NEURONTIN) 100 MG capsule Take 100 mg by mouth 2 (two) times daily.  11  . memantine (NAMENDA) 10 MG tablet Take by mouth.    Marland Kitchen omeprazole (PRILOSEC) 20 MG capsule Take 1 capsule (20 mg total) by mouth daily. 30 capsule 1  . predniSONE (DELTASONE) 10 MG tablet Take 3 tablets (30 mg total) by mouth daily with breakfast. 90 tablet 0  . predniSONE (DELTASONE) 20 MG tablet TAKE 2 TABLETS (40 MG TOTAL) BY MOUTH DAILY WITH BREAKFAST.  1   No current facility-administered medications for this visit.     Review of Systems:  GENERAL:  No complaints.  No fevers or sweats.  Weight up 2 pounds. PERFORMANCE STATUS (ECOG):  2 HEENT:  Possible glaucoma.  No visual changes, runny nose, sore throat, mouth sores or tenderness. Lungs: No shortness of breath or cough.  No hemoptysis. Cardiac:  No chest pain, palpitations, orthopnea, or PND. GI:  No nausea, vomiting, diarrhea, constipation, melena or hematochezia. GU:  No urgency, frequency, dysuria, or hematuria. Musculoskeletal:  No back pain.  No joint pain.  No muscle tenderness. Extremities:  No pain or swelling. Skin:  No rashes or skin changes.  No bruising. Neuro:  No headache, numbness or weakness, balance or coordination issues. Endocrine:  No diabetes, thyroid issues, hot flashes or night sweats. Psych:  No mood changes, depression or anxiety. Pain:  No focal pain. Review of systems:  All other systems reviewed and found to be negative.  Physical Exam: Blood pressure 135/85, pulse 75, temperature (!) 96.2 F (35.7 C), temperature source Tympanic, resp. rate 18, weight 188 lb 0.8 oz (85.3 kg). GENERAL:  Slightly disheveled foul smelling  gentleman sitting comfortably in the exam room in no acute distress.  MENTAL STATUS:  Alert and oriented to person, place and time. HEAD:  Short black hair.  Mustache.  Gray sideburns and beard.  Normocephalic, atraumatic, face symmetric, no Cushingoid features. EYES:  Glasses.  Brown eyes.  Pupils equal round and reactive to light and accomodation.  No conjunctivitis or scleral icterus. ENT:  Oropharynx clear without lesion.  Edentulous. Tongue normal. Mucous membranes moist.  RESPIRATORY:  Clear to auscultation without rales, wheezes or rhonchi. CARDIOVASCULAR:  Regular rate and rhythm without murmur, rub or gallop. ABDOMEN:  Soft, non-tender, with active bowel sounds, and no hepatosplenomegaly.  No masses. SKIN:  No rashes, ulcers or lesions. EXTREMITIES: Chronic lower extremity changes.  No skin discoloration or tenderness.  No palpable cords. LYMPH NODES: No palpable cervical, supraclavicular, axillary or inguinal adenopathy  NEUROLOGICAL: Unremarkable. PSYCH:  Appropriate.   Appointment on 09/03/2016  Component Date Value Ref Range Status  . WBC 09/03/2016 7.1  3.8 - 10.6 K/uL Final  . RBC 09/03/2016 4.47  4.40 - 5.90 MIL/uL Final  . Hemoglobin 09/03/2016 14.8  13.0 - 18.0 g/dL Final  . HCT 09/03/2016 44.0  40.0 - 52.0 % Final  . MCV 09/03/2016 98.5  80.0 - 100.0 fL Final  . MCH 09/03/2016 33.1  26.0 - 34.0 pg Final  . MCHC 09/03/2016 33.6  32.0 - 36.0 g/dL Final  . RDW 09/03/2016 13.7  11.5 - 14.5 % Final  . Platelets 09/03/2016 86* 150 - 440 K/uL Final   Comment: PLATELET COUNT CONFIRMED BY SMEAR PLATELETS VARIED IN SIZE LARGE PLATELETS PRESENT GIANT PLATELETS SEEN   . Neutrophils Relative % 09/03/2016 70  % Final  . Neutro Abs 09/03/2016 4.9  1.4 - 6.5 K/uL Final  . Lymphocytes Relative 09/03/2016 20  % Final  . Lymphs Abs 09/03/2016 1.4  1.0 - 3.6 K/uL Final  . Monocytes Relative 09/03/2016 10  % Final  . Monocytes Absolute 09/03/2016 0.7  0.2 - 1.0 K/uL Final  .  Eosinophils Relative 09/03/2016 0  % Final  . Eosinophils Absolute 09/03/2016 0.0  0 - 0.7 K/uL Final  . Basophils Relative 09/03/2016 0  % Final  . Basophils Absolute 09/03/2016 0.0  0 - 0.1 K/uL Final    Assessment:  Chesney Kaylor Bort is a 60 y.o. 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  Abdominal ultrasound on 05/11/2016 revealed a normal spleen and liver. Platelet count fluctuates (59,000 to 103,000).   He has iron deficiency anemia.  Diet appears good.  He denies any melena or hematochezia.  He denies any pica. Colonoscopy on 06/03/2014 revealed a tubular adenoma in the ascending colon negative for high grade dysplasia and malignancy.  He declines guaiac cards.  He has been on oral iron x  5 months.    Work-up on 04/10/2016 revealed a hematocrit of 30.6, hemoglobin 9.1, MCV 69, platelets 88,000, WBC 5400 with an ANC of 2400.  Differential was unremarkable.  Negative studies included:  hepatitis B surface antigen, hepatitis B core antibody total, hepatitis C antibody, HIV antibody, ANA, and TSH.  Ferritin was 10 (low) with a TIBC of 457 (high) and a saturation of 43%.  Retic was 3.3%.  B12 and folate were normal on 06/25/2016.  Platelet count is as follows: 59,000 on 03/08/2016, 88,000 on 04/10/2016, 71,000 on 05/28/2016, 42,000 on 06/25/2016, 42,000 on 07/02/2016, 90,000 on 07/09/2016, 51,000 on 07/16/2016, 46,000 on 07/23/2016, 74,000 on 07/30/2016, 57,000 on 08/06/2016, 53,000 on 08/13/2016, 59,000 on 08/20/2016, 68,000 on 08/27/2016, and 86,000 on 09/03/2016.  He has a history of head trauma in the 1970s.  He has iron deficiency on oral iron BID.  He denies any melena or hematochezia.  Ferritin was 29 on 08/06/2016.  He began prednisone 40mg  a day on 07/02/2016.  He is currently on prednisone 10 mg a day.  Symptomatically, he denies any bruising or bleeding.  Hematocrit is 44 on oral iron.  Platelet count is 86,000.  Ferritin is  29.  Plan: 1.  Labs today:  CBC with diff. 2.  Decrease prednisone to 5 mg a day. 3.  Refill omeprazole. 4.  Continue oral iron until ferritin 100. 5.  RTC in 2 weeks for CBC. 6.  RTC in 4 weeks for MD assessment and labs (CBC with diff, cortisol).   Lequita Asal, MD  09/03/2016, 12:23 PM

## 2016-09-03 ENCOUNTER — Inpatient Hospital Stay (HOSPITAL_BASED_OUTPATIENT_CLINIC_OR_DEPARTMENT_OTHER): Payer: Medicaid Other | Admitting: Hematology and Oncology

## 2016-09-03 ENCOUNTER — Inpatient Hospital Stay: Payer: Medicaid Other

## 2016-09-03 VITALS — BP 135/85 | HR 75 | Temp 96.2°F | Resp 18 | Wt 188.1 lb

## 2016-09-03 DIAGNOSIS — Z8041 Family history of malignant neoplasm of ovary: Secondary | ICD-10-CM

## 2016-09-03 DIAGNOSIS — D693 Immune thrombocytopenic purpura: Secondary | ICD-10-CM

## 2016-09-03 DIAGNOSIS — Z87828 Personal history of other (healed) physical injury and trauma: Secondary | ICD-10-CM

## 2016-09-03 DIAGNOSIS — Z79899 Other long term (current) drug therapy: Secondary | ICD-10-CM

## 2016-09-03 DIAGNOSIS — Z87891 Personal history of nicotine dependence: Secondary | ICD-10-CM

## 2016-09-03 DIAGNOSIS — Z7982 Long term (current) use of aspirin: Secondary | ICD-10-CM

## 2016-09-03 DIAGNOSIS — Z9049 Acquired absence of other specified parts of digestive tract: Secondary | ICD-10-CM

## 2016-09-03 DIAGNOSIS — D696 Thrombocytopenia, unspecified: Secondary | ICD-10-CM | POA: Diagnosis not present

## 2016-09-03 DIAGNOSIS — D509 Iron deficiency anemia, unspecified: Secondary | ICD-10-CM | POA: Diagnosis not present

## 2016-09-03 DIAGNOSIS — Z8042 Family history of malignant neoplasm of prostate: Secondary | ICD-10-CM

## 2016-09-03 DIAGNOSIS — Z800 Family history of malignant neoplasm of digestive organs: Secondary | ICD-10-CM

## 2016-09-03 DIAGNOSIS — Z86010 Personal history of colonic polyps: Secondary | ICD-10-CM

## 2016-09-03 DIAGNOSIS — Z7952 Long term (current) use of systemic steroids: Secondary | ICD-10-CM

## 2016-09-03 LAB — CBC WITH DIFFERENTIAL/PLATELET
Basophils Absolute: 0 10*3/uL (ref 0–0.1)
Basophils Relative: 0 %
Eosinophils Absolute: 0 10*3/uL (ref 0–0.7)
Eosinophils Relative: 0 %
HCT: 44 % (ref 40.0–52.0)
Hemoglobin: 14.8 g/dL (ref 13.0–18.0)
Lymphocytes Relative: 20 %
Lymphs Abs: 1.4 10*3/uL (ref 1.0–3.6)
MCH: 33.1 pg (ref 26.0–34.0)
MCHC: 33.6 g/dL (ref 32.0–36.0)
MCV: 98.5 fL (ref 80.0–100.0)
Monocytes Absolute: 0.7 10*3/uL (ref 0.2–1.0)
Monocytes Relative: 10 %
Neutro Abs: 4.9 10*3/uL (ref 1.4–6.5)
Neutrophils Relative %: 70 %
Platelets: 86 10*3/uL — ABNORMAL LOW (ref 150–440)
RBC: 4.47 MIL/uL (ref 4.40–5.90)
RDW: 13.7 % (ref 11.5–14.5)
WBC: 7.1 10*3/uL (ref 3.8–10.6)

## 2016-09-03 MED ORDER — OMEPRAZOLE 20 MG PO CPDR: 20.0000 mg | DELAYED_RELEASE_CAPSULE | Freq: Every day | ORAL | 1 refills | Status: DC

## 2016-09-03 NOTE — Progress Notes (Signed)
Patient states he is hoarse.  Does not complain of any sore throat.  Otherwise, no complaints.

## 2016-09-18 ENCOUNTER — Inpatient Hospital Stay: Payer: Medicaid Other | Attending: Hematology and Oncology

## 2016-09-18 DIAGNOSIS — Z87891 Personal history of nicotine dependence: Secondary | ICD-10-CM | POA: Insufficient documentation

## 2016-09-18 DIAGNOSIS — Z9049 Acquired absence of other specified parts of digestive tract: Secondary | ICD-10-CM | POA: Insufficient documentation

## 2016-09-18 DIAGNOSIS — Z87828 Personal history of other (healed) physical injury and trauma: Secondary | ICD-10-CM | POA: Insufficient documentation

## 2016-09-18 DIAGNOSIS — Z7982 Long term (current) use of aspirin: Secondary | ICD-10-CM | POA: Insufficient documentation

## 2016-09-18 DIAGNOSIS — Z7952 Long term (current) use of systemic steroids: Secondary | ICD-10-CM | POA: Insufficient documentation

## 2016-09-18 DIAGNOSIS — Z8041 Family history of malignant neoplasm of ovary: Secondary | ICD-10-CM | POA: Insufficient documentation

## 2016-09-18 DIAGNOSIS — D122 Benign neoplasm of ascending colon: Secondary | ICD-10-CM | POA: Insufficient documentation

## 2016-09-18 DIAGNOSIS — D509 Iron deficiency anemia, unspecified: Secondary | ICD-10-CM | POA: Insufficient documentation

## 2016-09-18 DIAGNOSIS — Z8042 Family history of malignant neoplasm of prostate: Secondary | ICD-10-CM | POA: Insufficient documentation

## 2016-09-18 DIAGNOSIS — D693 Immune thrombocytopenic purpura: Secondary | ICD-10-CM | POA: Insufficient documentation

## 2016-09-18 DIAGNOSIS — Z801 Family history of malignant neoplasm of trachea, bronchus and lung: Secondary | ICD-10-CM | POA: Insufficient documentation

## 2016-09-18 DIAGNOSIS — Z79899 Other long term (current) drug therapy: Secondary | ICD-10-CM | POA: Insufficient documentation

## 2016-09-27 ENCOUNTER — Other Ambulatory Visit: Payer: Self-pay | Admitting: *Deleted

## 2016-09-27 DIAGNOSIS — D509 Iron deficiency anemia, unspecified: Secondary | ICD-10-CM

## 2016-09-27 DIAGNOSIS — D693 Immune thrombocytopenic purpura: Secondary | ICD-10-CM

## 2016-09-27 MED ORDER — OMEPRAZOLE 20 MG PO CPDR: 20.0000 mg | DELAYED_RELEASE_CAPSULE | Freq: Every day | ORAL | 1 refills | Status: AC

## 2016-10-01 ENCOUNTER — Inpatient Hospital Stay: Payer: Medicaid Other | Admitting: Hematology and Oncology

## 2016-10-01 ENCOUNTER — Inpatient Hospital Stay: Payer: Medicaid Other

## 2016-10-07 ENCOUNTER — Encounter: Payer: Self-pay | Admitting: Hematology and Oncology

## 2016-10-07 NOTE — Progress Notes (Signed)
[facility] day:  10/08/2016   Chief Complaint: Julian [name] is a 61 y.o. male with iron deficiency anemia and immune mediated thrombocytopenic purpura (ITP) who seen for 1 month assessment on a prednisone taper.  HPI:  The patient was last seen in the hematology clinic on 09/03/2016.  At that time, he denied any problems.  Platelet count was 86,000.  His prednisone was tapered to 5 mg a day.  He continued oral iron.   Symptomatically, he is doing well.  He denies any bruising or bleeding.   Past Medical History:  Diagnosis Date  . Chronic ITP (idiopathic thrombocytopenia) (HCC)   . Iron deficiency anemia     Past Surgical History:  Procedure Laterality Date  . CHOLECYSTECTOMY      Family History  Problem Relation Age of Onset  . Hypertension Father   . Diabetes Father   . Lung cancer Brother   . Hypertension Maternal Grandmother   . Ovarian cancer Maternal Grandmother   . Diabetes Maternal Grandmother   . Prostate cancer Brother     Social History:  reports that he has quit smoking. He has never used smokeless tobacco. He reports that he does not drink alcohol. His drug history is not on file.  He previously smoked 1 pack per day.  He quit smoking this year.  He does not drink alcohol.  He does not take any non prescription drugs.  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  He denies any exposure to radiation or toxins.  He was in the [employer] for a few months.  He lives with his mom.  He has 1 son.  Hiis cousin, Denyce Robert, assists with his care.  Her contact numbers are: cell: [phone] and home: [phone].  He is accompanied by his cousin, Denyce Robert.    Allergies: No Known Allergies  Current Medications: Current Outpatient Prescriptions  Medication Sig Dispense Refill  . aspirin (ASPIRIN CHILDRENS) 81 MG chewable tablet Chew 81 mg by mouth daily.    Marland Kitchen donepezil (ARICEPT) 10 MG tablet Take 10 mg by mouth at  bedtime.    . ferrous sulfate 325 (65 FE) MG tablet Take 325 mg by mouth daily with breakfast.    . gabapentin (NEURONTIN) 100 MG capsule Take 100 mg by mouth 2 (two) times daily.  11  . memantine (NAMENDA) 10 MG tablet Take by mouth.    Marland Kitchen omeprazole (PRILOSEC) 20 MG capsule Take 1 capsule (20 mg total) by mouth daily. 30 capsule 1  . predniSONE (DELTASONE) 10 MG tablet Take 3 tablets (30 mg total) by mouth daily with breakfast. 90 tablet 0  . predniSONE (DELTASONE) 20 MG tablet TAKE 2 TABLETS (40 MG TOTAL) BY MOUTH DAILY WITH BREAKFAST.  1   No current facility-administered medications for this visit.     Review of Systems:  GENERAL:  Feels "ok".  No fevers or sweats.  Weight down 6 pounds. PERFORMANCE STATUS (ECOG):  2 HEENT:  Possible glaucoma.  No visual changes, runny nose, sore throat, mouth sores or tenderness. Lungs: No shortness of breath or cough.  No hemoptysis. Cardiac:  No chest pain, palpitations, orthopnea, or PND. GI:  No nausea, vomiting, diarrhea, constipation, melena or hematochezia. GU:  No urgency, frequency, dysuria, or hematuria. Musculoskeletal:  No back pain.  No joint pain.  No muscle tenderness. Extremities:  No pain or swelling. Skin:  No rashes or skin changes.  No bruising. Neuro:  No headache,  numbness or weakness, balance or coordination issues. Endocrine:  No diabetes, thyroid issues, hot flashes or night sweats. Psych:  No mood changes, depression or anxiety. Pain:  No focal pain. Review of systems:  All other systems reviewed and found to be negative.  Physical Exam: Blood pressure 130/90, pulse 84, temperature 97.5 F (36.4 C), temperature source Tympanic, resp. rate 18, weight 182 lb 1.6 oz (82.6 kg). GENERAL:  Disheveled foul smelling gentleman sitting comfortably in the exam room in no acute distress.  MENTAL STATUS:  Alert and oriented to person, place and time. HEAD:  Short black hair.  Mustache.  Gray sideburns and beard.  Normocephalic,  atraumatic, face symmetric, no Cushingoid features. EYES:  Glasses.  Brown eyes.  Pupils equal round and reactive to light and accomodation.  No conjunctivitis or scleral icterus. ENT:  Oropharynx clear without lesion.  Edentulous. Tongue normal. Mucous membranes moist.  RESPIRATORY:  Clear to auscultation without rales, wheezes or rhonchi. CARDIOVASCULAR:  Regular rate and rhythm without murmur, rub or gallop. ABDOMEN:  Soft, non-tender, with active bowel sounds, and no hepatosplenomegaly.  No masses. SKIN:  No rashes, ulcers or lesions. No petechiae. EXTREMITIES: Chronic lower extremity changes.  No skin discoloration or tenderness.  No palpable cords. LYMPH NODES: No palpable cervical, supraclavicular, axillary or inguinal adenopathy  NEUROLOGICAL: Unremarkable. PSYCH:  Appropriate.   Appointment on 10/08/2016  Component Date Value Ref Range Status  . WBC 10/08/2016 8.3  3.8 - 10.6 K/uL Final  . RBC 10/08/2016 4.80  4.40 - 5.90 MIL/uL Final  . Hemoglobin 10/08/2016 15.9  13.0 - 18.0 g/dL Final  . HCT 10/08/2016 46.4  40.0 - 52.0 % Final  . MCV 10/08/2016 96.7  80.0 - 100.0 fL Final  . MCH 10/08/2016 33.2  26.0 - 34.0 pg Final  . MCHC 10/08/2016 34.4  32.0 - 36.0 g/dL Final  . RDW 10/08/2016 13.0  11.5 - 14.5 % Final  . Platelets 10/08/2016 PENDING  150 - 400 K/uL Incomplete  . Neutrophils Relative % 10/08/2016 74  % Final  . Neutro Abs 10/08/2016 6.1  1.4 - 6.5 K/uL Final  . Lymphocytes Relative 10/08/2016 13  % Final  . Lymphs Abs 10/08/2016 1.1  1.0 - 3.6 K/uL Final  . Monocytes Relative 10/08/2016 13  % Final  . Monocytes Absolute 10/08/2016 1.0  0.2 - 1.0 K/uL Final  . Eosinophils Relative 10/08/2016 0  % Final  . Eosinophils Absolute 10/08/2016 0.0  0 - 0.7 K/uL Final  . Basophils Relative 10/08/2016 0  % Final  . Basophils Absolute 10/08/2016 0.0  0 - 0.1 K/uL Final    Assessment:  Julian [name] is a 61 y.o. male with mild thrombocytopenia likely secondary to  immune mediated thrombocytopenic purpura (ITP).  Abdominal ultrasound on 05/11/2016 revealed a normal spleen and liver. Platelet count fluctuates (59,000 to 103,000).   He has iron deficiency anemia.  Diet appears good.  He denies any melena or hematochezia.  He denies any pica. Colonoscopy on 06/03/2014 revealed a tubular adenoma in the ascending colon negative for high grade dysplasia and malignancy.  He declines guaiac cards.  He has been on oral iron x 5 months.    Work-up on 04/10/2016 revealed a hematocrit of 30.6, hemoglobin 9.1, MCV 69, platelets 88,000, WBC 5400 with an ANC of 2400.  Differential was unremarkable.  Negative studies included:  hepatitis B surface antigen, hepatitis B core antibody total, hepatitis C antibody, HIV antibody, ANA, and TSH.  Ferritin was 10 (low)  with a TIBC of 457 (high) and a saturation of 43%.  Retic was 3.3%.  B12 and folate were normal on 06/25/2016.  Platelet count is as follows: 59,000 on 03/08/2016, 88,000 on 04/10/2016, 71,000 on 05/28/2016, 42,000 on 06/25/2016, 42,000 on 07/02/2016, 90,000 on 07/09/2016, 51,000 on 07/16/2016, 46,000 on 07/23/2016, 74,000 on 07/30/2016, 57,000 on 08/06/2016, 53,000 on 08/13/2016, 59,000 on 08/20/2016, 68,000 on 08/27/2016, 86,000 on 09/03/2016, and 53,000 on 10/08/2016.  He has a history of head trauma in the 1970s.  He has iron deficiency on oral iron BID.  He denies any melena or hematochezia.  Ferritin was 29 on 08/06/2016.  He began prednisone 40mg  a day on 07/02/2016.  He is currently on prednisone 5 mg a day.  Symptomatically, he denies any bruising or bleeding.  Hematocrit is 46.4 on oral iron.  Platelet count has dropped to 53,000.  Ferritin is 90.  Plan: 1.  Labs today:  CBC with diff, ferritin. 2.  Increase prednisone to 5 mg a day alternating with 10 mg a day. 3.  RTC weekly for CBC. 4.  RTC in 4 weeks for MD assessment and labs (CBC with diff, ferritin)    Lequita Asal, MD  10/08/2016, 10:28 AM

## 2016-10-08 ENCOUNTER — Inpatient Hospital Stay: Payer: Medicaid Other

## 2016-10-08 ENCOUNTER — Inpatient Hospital Stay (HOSPITAL_BASED_OUTPATIENT_CLINIC_OR_DEPARTMENT_OTHER): Payer: Medicaid Other | Admitting: Hematology and Oncology

## 2016-10-08 VITALS — BP 130/90 | HR 84 | Temp 97.5°F | Resp 18 | Wt 182.1 lb

## 2016-10-08 DIAGNOSIS — D693 Immune thrombocytopenic purpura: Secondary | ICD-10-CM

## 2016-10-08 DIAGNOSIS — Z87828 Personal history of other (healed) physical injury and trauma: Secondary | ICD-10-CM | POA: Diagnosis not present

## 2016-10-08 DIAGNOSIS — Z7982 Long term (current) use of aspirin: Secondary | ICD-10-CM | POA: Diagnosis not present

## 2016-10-08 DIAGNOSIS — Z801 Family history of malignant neoplasm of trachea, bronchus and lung: Secondary | ICD-10-CM | POA: Diagnosis not present

## 2016-10-08 DIAGNOSIS — Z79899 Other long term (current) drug therapy: Secondary | ICD-10-CM | POA: Diagnosis not present

## 2016-10-08 DIAGNOSIS — Z9049 Acquired absence of other specified parts of digestive tract: Secondary | ICD-10-CM | POA: Diagnosis not present

## 2016-10-08 DIAGNOSIS — Z8041 Family history of malignant neoplasm of ovary: Secondary | ICD-10-CM | POA: Diagnosis not present

## 2016-10-08 DIAGNOSIS — Z87891 Personal history of nicotine dependence: Secondary | ICD-10-CM | POA: Diagnosis not present

## 2016-10-08 DIAGNOSIS — D122 Benign neoplasm of ascending colon: Secondary | ICD-10-CM

## 2016-10-08 DIAGNOSIS — D509 Iron deficiency anemia, unspecified: Secondary | ICD-10-CM

## 2016-10-08 DIAGNOSIS — Z8042 Family history of malignant neoplasm of prostate: Secondary | ICD-10-CM | POA: Diagnosis not present

## 2016-10-08 DIAGNOSIS — Z7952 Long term (current) use of systemic steroids: Secondary | ICD-10-CM

## 2016-10-08 LAB — CBC WITH DIFFERENTIAL/PLATELET
Basophils Absolute: 0 10*3/uL (ref 0–0.1)
Basophils Relative: 0 %
Eosinophils Absolute: 0 10*3/uL (ref 0–0.7)
Eosinophils Relative: 0 %
HCT: 46.4 % (ref 40.0–52.0)
Hemoglobin: 15.9 g/dL (ref 13.0–18.0)
Lymphocytes Relative: 13 %
Lymphs Abs: 1.1 10*3/uL (ref 1.0–3.6)
MCH: 33.2 pg (ref 26.0–34.0)
MCHC: 34.4 g/dL (ref 32.0–36.0)
MCV: 96.7 fL (ref 80.0–100.0)
Monocytes Absolute: 1 10*3/uL (ref 0.2–1.0)
Monocytes Relative: 13 %
Neutro Abs: 6.1 10*3/uL (ref 1.4–6.5)
Neutrophils Relative %: 74 %
Platelets: 53 10*3/uL — ABNORMAL LOW (ref 150–440)
RBC: 4.8 MIL/uL (ref 4.40–5.90)
RDW: 13 % (ref 11.5–14.5)
WBC: 8.3 10*3/uL (ref 3.8–10.6)

## 2016-10-08 LAB — FERRITIN: Ferritin: 90 ng/mL (ref 24–336)

## 2016-10-08 MED ORDER — PREDNISONE 5 MG PO TABS: ORAL_TABLET | ORAL | 1 refills | Status: AC

## 2016-10-08 NOTE — Progress Notes (Signed)
Patient is unsteady on his feet today.  He seems wobbly when walking but denies any dizziness or lightheadedness.

## 2016-10-15 ENCOUNTER — Inpatient Hospital Stay: Payer: Medicaid Other

## 2016-10-15 DIAGNOSIS — D693 Immune thrombocytopenic purpura: Secondary | ICD-10-CM

## 2016-10-15 DIAGNOSIS — D509 Iron deficiency anemia, unspecified: Secondary | ICD-10-CM

## 2016-10-15 LAB — CBC WITH DIFFERENTIAL/PLATELET
Basophils Absolute: 0.1 10*3/uL (ref 0–0.1)
Basophils Relative: 1 %
Eosinophils Absolute: 0.2 10*3/uL (ref 0–0.7)
Eosinophils Relative: 1 %
HCT: 42.1 % (ref 40.0–52.0)
Hemoglobin: 14.1 g/dL (ref 13.0–18.0)
Lymphocytes Relative: 40 %
Lymphs Abs: 4.4 10*3/uL — ABNORMAL HIGH (ref 1.0–3.6)
MCH: 32.9 pg (ref 26.0–34.0)
MCHC: 33.4 g/dL (ref 32.0–36.0)
MCV: 98.5 fL (ref 80.0–100.0)
Monocytes Absolute: 1.3 10*3/uL — ABNORMAL HIGH (ref 0.2–1.0)
Monocytes Relative: 12 %
Neutro Abs: 5.1 10*3/uL (ref 1.4–6.5)
Neutrophils Relative %: 46 %
Platelets: 85 10*3/uL — ABNORMAL LOW (ref 150–400)
RBC: 4.27 MIL/uL — ABNORMAL LOW (ref 4.40–5.90)
RDW: 13.3 % (ref 11.5–14.5)
WBC: 11.1 10*3/uL — ABNORMAL HIGH (ref 3.8–10.6)

## 2016-10-17 ENCOUNTER — Telehealth: Payer: Self-pay | Admitting: *Deleted

## 2016-10-17 NOTE — Telephone Encounter (Signed)
Julian [name], caretaker for patient to inform her that per MD VO patient should only  take 5 mg Prednisone daily.

## 2016-10-17 NOTE — Telephone Encounter (Signed)
Per Thayer Headings, caretaker for patient - he is alternating 5 mg prednisone one day and 10 mg the next day.

## 2016-10-17 NOTE — Telephone Encounter (Signed)
-----   Message from Lequita Asal, MD sent at 10/17/2016  9:04 AM EST ----- Regarding: Please call patient  What dose of prednisone is he on?  M  ----- Message ----- From: Interface, Lab In [place] Sent: 10/15/2016   9:53 AM To: Lequita Asal, MD

## 2016-10-17 NOTE — Telephone Encounter (Signed)
  Decrease prednisone to 5 mg a day.  M

## 2016-10-22 ENCOUNTER — Inpatient Hospital Stay: Payer: Medicare (Managed Care) | Attending: Hematology and Oncology

## 2016-10-22 DIAGNOSIS — Z87828 Personal history of other (healed) physical injury and trauma: Secondary | ICD-10-CM | POA: Diagnosis not present

## 2016-10-22 DIAGNOSIS — D122 Benign neoplasm of ascending colon: Secondary | ICD-10-CM | POA: Insufficient documentation

## 2016-10-22 DIAGNOSIS — D693 Immune thrombocytopenic purpura: Secondary | ICD-10-CM | POA: Insufficient documentation

## 2016-10-22 DIAGNOSIS — Z8042 Family history of malignant neoplasm of prostate: Secondary | ICD-10-CM | POA: Diagnosis not present

## 2016-10-22 DIAGNOSIS — Z87891 Personal history of nicotine dependence: Secondary | ICD-10-CM | POA: Diagnosis not present

## 2016-10-22 DIAGNOSIS — Z79899 Other long term (current) drug therapy: Secondary | ICD-10-CM | POA: Insufficient documentation

## 2016-10-22 DIAGNOSIS — D509 Iron deficiency anemia, unspecified: Secondary | ICD-10-CM | POA: Insufficient documentation

## 2016-10-22 DIAGNOSIS — Z7982 Long term (current) use of aspirin: Secondary | ICD-10-CM | POA: Diagnosis not present

## 2016-10-22 DIAGNOSIS — Z8041 Family history of malignant neoplasm of ovary: Secondary | ICD-10-CM | POA: Insufficient documentation

## 2016-10-22 DIAGNOSIS — Z801 Family history of malignant neoplasm of trachea, bronchus and lung: Secondary | ICD-10-CM | POA: Insufficient documentation

## 2016-10-22 LAB — CBC WITH DIFFERENTIAL/PLATELET
Basophils Absolute: 0 10*3/uL (ref 0–0.1)
Basophils Relative: 1 %
Eosinophils Absolute: 0 10*3/uL (ref 0–0.7)
Eosinophils Relative: 0 %
HCT: 44.1 % (ref 40.0–52.0)
Hemoglobin: 14.5 g/dL (ref 13.0–18.0)
Lymphocytes Relative: 30 %
Lymphs Abs: 1.7 10*3/uL (ref 1.0–3.6)
MCH: 32.7 pg (ref 26.0–34.0)
MCHC: 32.9 g/dL (ref 32.0–36.0)
MCV: 99.2 fL (ref 80.0–100.0)
Monocytes Absolute: 0.7 10*3/uL (ref 0.2–1.0)
Monocytes Relative: 12 %
Neutro Abs: 3.4 10*3/uL (ref 1.4–6.5)
Neutrophils Relative %: 57 %
Platelets: 106 10*3/uL — ABNORMAL LOW (ref 150–440)
RBC: 4.44 MIL/uL (ref 4.40–5.90)
RDW: 13.4 % (ref 11.5–14.5)
WBC: 5.9 10*3/uL (ref 3.8–10.6)

## 2016-10-29 ENCOUNTER — Telehealth: Payer: Self-pay | Admitting: *Deleted

## 2016-10-29 ENCOUNTER — Inpatient Hospital Stay: Payer: Medicare (Managed Care)

## 2016-10-29 DIAGNOSIS — D693 Immune thrombocytopenic purpura: Secondary | ICD-10-CM | POA: Diagnosis not present

## 2016-10-29 DIAGNOSIS — D509 Iron deficiency anemia, unspecified: Secondary | ICD-10-CM

## 2016-10-29 LAB — CBC WITH DIFFERENTIAL/PLATELET
Basophils Absolute: 0.1 10*3/uL (ref 0–0.1)
Basophils Relative: 1 %
Eosinophils Absolute: 0 10*3/uL (ref 0–0.7)
Eosinophils Relative: 1 %
HCT: 43.6 % (ref 40.0–52.0)
Hemoglobin: 14.7 g/dL (ref 13.0–18.0)
Lymphocytes Relative: 54 %
Lymphs Abs: 2.2 10*3/uL (ref 1.0–3.6)
MCH: 32.8 pg (ref 26.0–34.0)
MCHC: 33.7 g/dL (ref 32.0–36.0)
MCV: 97.5 fL (ref 80.0–100.0)
Monocytes Absolute: 0.5 10*3/uL (ref 0.2–1.0)
Monocytes Relative: 13 %
Neutro Abs: 1.2 10*3/uL — ABNORMAL LOW (ref 1.4–6.5)
Neutrophils Relative %: 31 %
Platelets: 59 10*3/uL — ABNORMAL LOW (ref 150–440)
RBC: 4.47 MIL/uL (ref 4.40–5.90)
RDW: 13.2 % (ref 11.5–14.5)
WBC: 4 10*3/uL (ref 3.8–10.6)

## 2016-10-30 NOTE — Telephone Encounter (Signed)
Patient informed that his prednisoe dosage should stay the same at 5 mg daily and we would recheck it next week at his scheduled appointment, voiced understanding.

## 2016-11-04 ENCOUNTER — Encounter: Payer: Self-pay | Admitting: Hematology and Oncology

## 2016-11-04 NOTE — Progress Notes (Signed)
[facility] day:  11/05/2016   Chief Complaint: Julian [name] is a 61 y.o. male with iron deficiency anemia and immune mediated thrombocytopenic purpura (ITP) who seen for 1 month assessment on a prednisone taper.  HPI:  The patient was last seen in the hematology clinic on 10/08/2016.  At that time, he denied any problems.  Platelet count had dropped to 53,000.  Decision was made to increase his prednisone to 10 mg alternating with 5 mg a day.  Ferritin was 90 (goal 100).  He continued oral iron.   He has had weekly platelet counts.  Platelet count was 85,000 on 10/15/2016, 106,000 on 10/22/2016, and 59,000 on 10/29/2016. Prednisone was decreased to 5 mg a day on 10/17/2016.  He has remained on prednisone 5 mg a day.  Symptomatically, he is doing well.  He denies any bruising or bleeding.   Past Medical History:  Diagnosis Date  . Chronic ITP (idiopathic thrombocytopenia) (HCC)   . Iron deficiency anemia     Past Surgical History:  Procedure Laterality Date  . CHOLECYSTECTOMY      Family History  Problem Relation Age of Onset  . Hypertension Father   . Diabetes Father   . Lung cancer Brother   . Hypertension Maternal Grandmother   . Ovarian cancer Maternal Grandmother   . Diabetes Maternal Grandmother   . Prostate cancer Brother     Social History:  reports that he has quit smoking. He has never used smokeless tobacco. He reports that he does not drink alcohol. His drug history is not on file.  He previously smoked 1 pack per day.  He quit smoking this year.  He does not drink alcohol.  He does not take any non prescription drugs.  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  He denies any exposure to radiation or toxins.  He was in the [employer] for a few months.  He lives with his mom.  He has 1 son.  Hiis cousin, Denyce Robert, assists with his care.  Her contact numbers are: cell: [phone] and home: [phone].  He is  accompanied by the husband of his cousin, Denyce Robert.  He states that the patient is enrolled in the [employer] program.  Allergies: No Known Allergies  Current Medications: Current Outpatient Prescriptions  Medication Sig Dispense Refill  . aspirin (ASPIRIN CHILDRENS) 81 MG chewable tablet Chew 81 mg by mouth daily.    Marland Kitchen donepezil (ARICEPT) 10 MG tablet Take 10 mg by mouth at bedtime.    . ferrous sulfate 325 (65 FE) MG tablet Take 325 mg by mouth daily with breakfast.    . gabapentin (NEURONTIN) 100 MG capsule Take 100 mg by mouth 2 (two) times daily.  11  . memantine (NAMENDA) 10 MG tablet Take by mouth.    Marland Kitchen omeprazole (PRILOSEC) 20 MG capsule Take 1 capsule (20 mg total) by mouth daily. 30 capsule 1  . predniSONE (DELTASONE) 20 MG tablet TAKE 2 TABLETS (40 MG TOTAL) BY MOUTH DAILY WITH BREAKFAST.  1  . predniSONE (DELTASONE) 5 MG tablet Take 5 mg (1 pill) alternating with 10 mg (2 pills) every other day. 30 tablet 1   No current facility-administered medications for this visit.     Review of Systems:  GENERAL:  Feels "ok".  No fevers or sweats.  Weight up 2pounds. PERFORMANCE STATUS (ECOG):  2 HEENT:  Possible glaucoma.  No visual changes, runny nose, sore throat, mouth sores or  tenderness. Lungs: No shortness of breath or cough.  No hemoptysis. Cardiac:  No chest pain, palpitations, orthopnea, or PND. GI:  No nausea, vomiting, diarrhea, constipation, melena or hematochezia. GU:  No urgency, frequency, dysuria, or hematuria. Musculoskeletal:  No back pain.  No joint pain.  No muscle tenderness. Extremities:  No pain or swelling. Skin:  No rashes or skin changes.  No bruising. Neuro:  No headache, numbness or weakness, balance or coordination issues. Endocrine:  No diabetes, thyroid issues, hot flashes or night sweats. Psych:  No mood changes, depression or anxiety. Pain:  No focal pain. Review of systems:  All other systems reviewed and found to be negative.  Physical  Exam: Blood pressure 119/75, pulse 62, temperature (!) 95.9 F (35.5 C), temperature source Tympanic, resp. rate 18, weight 184 lb 15.5 oz (83.9 kg). GENERAL:  Disheveled foul smelling gentleman sitting comfortably in the exam room in no acute distress.  MENTAL STATUS:  Alert and oriented to person, place and time. HEAD:  Short black hair.  Mustache.  Gray sideburns and beard.  Normocephalic, atraumatic, face symmetric, no Cushingoid features. EYES:  Glasses.  Brown eyes.  Pupils equal round and reactive to light and accomodation.  No conjunctivitis or scleral icterus. ENT:  Oropharynx clear without lesion.  Edentulous. Tongue normal. Mucous membranes moist.  RESPIRATORY:  Clear to auscultation without rales, wheezes or rhonchi. CARDIOVASCULAR:  Regular rate and rhythm without murmur, rub or gallop. ABDOMEN:  Soft, non-tender, with active bowel sounds, and no hepatosplenomegaly.  No masses. SKIN:  No rashes, ulcers or lesions. No petechiae. EXTREMITIES: Chronic lower extremity changes.  No skin discoloration or tenderness.  No palpable cords. LYMPH NODES: No palpable cervical, supraclavicular, axillary or inguinal adenopathy  NEUROLOGICAL: Unremarkable. PSYCH:  Appropriate.   Appointment on 11/05/2016  Component Date Value Ref Range Status  . WBC 11/05/2016 4.9  3.8 - 10.6 K/uL Final  . RBC 11/05/2016 4.41  4.40 - 5.90 MIL/uL Final  . Hemoglobin 11/05/2016 14.5  13.0 - 18.0 g/dL Final  . HCT 11/05/2016 43.1  40.0 - 52.0 % Final  . MCV 11/05/2016 97.6  80.0 - 100.0 fL Final  . MCH 11/05/2016 32.9  26.0 - 34.0 pg Final  . MCHC 11/05/2016 33.7  32.0 - 36.0 g/dL Final  . RDW 11/05/2016 13.3  11.5 - 14.5 % Final  . Platelets 11/05/2016 48* 150 - 400 K/uL Final   Comment: RESULT REPEATED AND VERIFIED PLATELET COUNT CONFIRMED BY SMEAR GIANT PLATELETS SEEN   . Neutrophils Relative % 11/05/2016 30  % Final  . Neutro Abs 11/05/2016 1.5  1.4 - 6.5 K/uL Final  . Lymphocytes Relative 11/05/2016  57  % Final  . Lymphs Abs 11/05/2016 2.8  1.0 - 3.6 K/uL Final  . Monocytes Relative 11/05/2016 11  % Final  . Monocytes Absolute 11/05/2016 0.6  0.2 - 1.0 K/uL Final  . Eosinophils Relative 11/05/2016 2  % Final  . Eosinophils Absolute 11/05/2016 0.1  0 - 0.7 K/uL Final  . Basophils Relative 11/05/2016 0  % Final  . Basophils Absolute 11/05/2016 0.0  0 - 0.1 K/uL Final    Assessment:  Julian [name] is a 61 y.o. 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  Abdominal ultrasound on 05/11/2016 revealed a normal spleen and liver. Platelet count fluctuates (59,000 to 103,000).   He has iron deficiency anemia.  Diet appears good.  He denies any melena or hematochezia.  He denies any pica. Colonoscopy  on 06/03/2014 revealed a tubular adenoma in the ascending colon negative for high grade dysplasia and malignancy.  He declines guaiac cards.  He has been on oral iron x 5 months.    Work-up on 04/10/2016 revealed a hematocrit of 30.6, hemoglobin 9.1, MCV 69, platelets 88,000, WBC 5400 with an ANC of 2400.  Differential was unremarkable.  Negative studies included:  hepatitis B surface antigen, hepatitis B core antibody total, hepatitis C antibody, HIV antibody, ANA, and TSH.  Ferritin was 10 (low) with a TIBC of 457 (high) and a saturation of 43%.  Retic was 3.3%.  B12 and folate were normal on 06/25/2016.  Platelet count is as follows: 59,000 on 03/08/2016, 88,000 on 04/10/2016, 71,000 on 05/28/2016, 42,000 on 06/25/2016, 42,000 on 07/02/2016, 90,000 on 07/09/2016, 51,000 on 07/16/2016, 46,000 on 07/23/2016, 74,000 on 07/30/2016, 57,000 on 08/06/2016, 53,000 on 08/13/2016, 59,000 on 08/20/2016, 68,000 on 08/27/2016, 86,000 on 09/03/2016, 53,000 on 10/08/2016, 85,000 on 10/15/2016, 106,000 on 10/22/2016, 59,000 on 10/29/2016, and 48,000 on 11/05/2016.  He has a history of head trauma in the 1970s.  He has iron deficiency on oral iron BID.  He  denies any melena or hematochezia.  Ferritin was 29 on 08/06/2016.  He began prednisone 40mg  a day on 07/02/2016.  He is currently on prednisone 5 mg a day.  Symptomatically, he denies any bruising or bleeding.  Hematocrit is 46.4 on oral iron.  Platelet count is 48,000.  Ferritin is 47.  Plan: 1.  Labs today:  CBC with diff, ferritin. 2.  Continue prednisone 5 mg a day. [place] (socail work) to assist with hygiene issues 4.  RTC weekly for CBC 5.  RTC in 1 month for MD assessment and labs (CBC with diff)   Lequita Asal, MD  11/05/2016, 11:04 AM

## 2016-11-05 ENCOUNTER — Encounter: Payer: Self-pay | Admitting: Hematology and Oncology

## 2016-11-05 ENCOUNTER — Inpatient Hospital Stay (HOSPITAL_BASED_OUTPATIENT_CLINIC_OR_DEPARTMENT_OTHER): Payer: Medicare (Managed Care) | Admitting: Hematology and Oncology

## 2016-11-05 ENCOUNTER — Inpatient Hospital Stay: Payer: Medicare (Managed Care)

## 2016-11-05 VITALS — BP 119/75 | HR 62 | Temp 95.9°F | Resp 18 | Wt 185.0 lb

## 2016-11-05 DIAGNOSIS — Z87891 Personal history of nicotine dependence: Secondary | ICD-10-CM | POA: Diagnosis not present

## 2016-11-05 DIAGNOSIS — Z8042 Family history of malignant neoplasm of prostate: Secondary | ICD-10-CM

## 2016-11-05 DIAGNOSIS — Z7982 Long term (current) use of aspirin: Secondary | ICD-10-CM

## 2016-11-05 DIAGNOSIS — Z79899 Other long term (current) drug therapy: Secondary | ICD-10-CM

## 2016-11-05 DIAGNOSIS — Z87828 Personal history of other (healed) physical injury and trauma: Secondary | ICD-10-CM | POA: Diagnosis not present

## 2016-11-05 DIAGNOSIS — Z801 Family history of malignant neoplasm of trachea, bronchus and lung: Secondary | ICD-10-CM | POA: Diagnosis not present

## 2016-11-05 DIAGNOSIS — D693 Immune thrombocytopenic purpura: Secondary | ICD-10-CM | POA: Diagnosis not present

## 2016-11-05 DIAGNOSIS — Z8041 Family history of malignant neoplasm of ovary: Secondary | ICD-10-CM

## 2016-11-05 DIAGNOSIS — D509 Iron deficiency anemia, unspecified: Secondary | ICD-10-CM

## 2016-11-05 DIAGNOSIS — D122 Benign neoplasm of ascending colon: Secondary | ICD-10-CM

## 2016-11-05 LAB — CBC WITH DIFFERENTIAL/PLATELET
Basophils Absolute: 0 10*3/uL (ref 0–0.1)
Basophils Relative: 0 %
Eosinophils Absolute: 0.1 10*3/uL (ref 0–0.7)
Eosinophils Relative: 2 %
HCT: 43.1 % (ref 40.0–52.0)
Hemoglobin: 14.5 g/dL (ref 13.0–18.0)
Lymphocytes Relative: 57 %
Lymphs Abs: 2.8 10*3/uL (ref 1.0–3.6)
MCH: 32.9 pg (ref 26.0–34.0)
MCHC: 33.7 g/dL (ref 32.0–36.0)
MCV: 97.6 fL (ref 80.0–100.0)
Monocytes Absolute: 0.6 10*3/uL (ref 0.2–1.0)
Monocytes Relative: 11 %
Neutro Abs: 1.5 10*3/uL (ref 1.4–6.5)
Neutrophils Relative %: 30 %
Platelets: 48 10*3/uL — ABNORMAL LOW (ref 150–400)
RBC: 4.41 MIL/uL (ref 4.40–5.90)
RDW: 13.3 % (ref 11.5–14.5)
WBC: 4.9 10*3/uL (ref 3.8–10.6)

## 2016-11-05 LAB — FERRITIN: Ferritin: 47 ng/mL (ref 24–336)

## 2016-11-05 NOTE — Progress Notes (Signed)
Patient offers no complaints today.  Accompanied by caretaker's husband today.

## 2016-11-12 ENCOUNTER — Inpatient Hospital Stay: Payer: Medicare (Managed Care)

## 2016-11-12 DIAGNOSIS — D693 Immune thrombocytopenic purpura: Secondary | ICD-10-CM

## 2016-11-12 DIAGNOSIS — D509 Iron deficiency anemia, unspecified: Secondary | ICD-10-CM

## 2016-11-12 LAB — CBC WITH DIFFERENTIAL/PLATELET
Basophils Absolute: 0 10*3/uL (ref 0–0.1)
Basophils Relative: 1 %
Eosinophils Absolute: 0 10*3/uL (ref 0–0.7)
Eosinophils Relative: 1 %
HCT: 43.7 % (ref 40.0–52.0)
Hemoglobin: 14.7 g/dL (ref 13.0–18.0)
Lymphocytes Relative: 38 %
Lymphs Abs: 1.5 10*3/uL (ref 1.0–3.6)
MCH: 32.8 pg (ref 26.0–34.0)
MCHC: 33.6 g/dL (ref 32.0–36.0)
MCV: 97.7 fL (ref 80.0–100.0)
Monocytes Absolute: 0.3 10*3/uL (ref 0.2–1.0)
Monocytes Relative: 8 %
Neutro Abs: 2 10*3/uL (ref 1.4–6.5)
Neutrophils Relative %: 52 %
Platelets: 66 10*3/uL — ABNORMAL LOW (ref 150–400)
RBC: 4.47 MIL/uL (ref 4.40–5.90)
RDW: 13.1 % (ref 11.5–14.5)
WBC: 3.9 10*3/uL (ref 3.8–10.6)

## 2016-11-19 ENCOUNTER — Telehealth: Payer: Self-pay | Admitting: *Deleted

## 2016-11-19 ENCOUNTER — Inpatient Hospital Stay: Payer: Medicare (Managed Care) | Attending: Hematology and Oncology

## 2016-11-19 DIAGNOSIS — Z8042 Family history of malignant neoplasm of prostate: Secondary | ICD-10-CM | POA: Insufficient documentation

## 2016-11-19 DIAGNOSIS — Z9049 Acquired absence of other specified parts of digestive tract: Secondary | ICD-10-CM | POA: Diagnosis not present

## 2016-11-19 DIAGNOSIS — D693 Immune thrombocytopenic purpura: Secondary | ICD-10-CM

## 2016-11-19 DIAGNOSIS — Z7952 Long term (current) use of systemic steroids: Secondary | ICD-10-CM | POA: Diagnosis not present

## 2016-11-19 DIAGNOSIS — Z8041 Family history of malignant neoplasm of ovary: Secondary | ICD-10-CM | POA: Diagnosis not present

## 2016-11-19 DIAGNOSIS — D509 Iron deficiency anemia, unspecified: Secondary | ICD-10-CM | POA: Insufficient documentation

## 2016-11-19 DIAGNOSIS — Z87891 Personal history of nicotine dependence: Secondary | ICD-10-CM | POA: Insufficient documentation

## 2016-11-19 DIAGNOSIS — Z79899 Other long term (current) drug therapy: Secondary | ICD-10-CM | POA: Insufficient documentation

## 2016-11-19 DIAGNOSIS — Z87828 Personal history of other (healed) physical injury and trauma: Secondary | ICD-10-CM | POA: Insufficient documentation

## 2016-11-19 DIAGNOSIS — Z801 Family history of malignant neoplasm of trachea, bronchus and lung: Secondary | ICD-10-CM | POA: Diagnosis not present

## 2016-11-19 LAB — CBC WITH DIFFERENTIAL/PLATELET
Basophils Absolute: 0 10*3/uL (ref 0–0.1)
Basophils Relative: 1 %
Eosinophils Absolute: 0 10*3/uL (ref 0–0.7)
Eosinophils Relative: 0 %
HCT: 44.6 % (ref 40.0–52.0)
Hemoglobin: 15.2 g/dL (ref 13.0–18.0)
Lymphocytes Relative: 38 %
Lymphs Abs: 2 10*3/uL (ref 1.0–3.6)
MCH: 33 pg (ref 26.0–34.0)
MCHC: 34.1 g/dL (ref 32.0–36.0)
MCV: 96.9 fL (ref 80.0–100.0)
Monocytes Absolute: 0.6 10*3/uL (ref 0.2–1.0)
Monocytes Relative: 11 %
Neutro Abs: 2.6 10*3/uL (ref 1.4–6.5)
Neutrophils Relative %: 50 %
Platelets: 78 10*3/uL — ABNORMAL LOW (ref 150–400)
RBC: 4.6 MIL/uL (ref 4.40–5.90)
RDW: 13.1 % (ref 11.5–14.5)
WBC: 5.2 10*3/uL (ref 3.8–10.6)

## 2016-11-19 NOTE — Telephone Encounter (Signed)
Taking prednisone 5 mg daily at this time per caregiver.

## 2016-11-22 ENCOUNTER — Telehealth: Payer: Self-pay | Admitting: *Deleted

## 2016-11-22 NOTE — Telephone Encounter (Signed)
Called patient and informed caregiver Julian [name] that he is to decrease his prednisone to 5 mg qod, voiced understanding.

## 2016-11-26 ENCOUNTER — Inpatient Hospital Stay: Payer: Medicare (Managed Care)

## 2016-12-03 ENCOUNTER — Inpatient Hospital Stay (HOSPITAL_BASED_OUTPATIENT_CLINIC_OR_DEPARTMENT_OTHER): Payer: Medicare (Managed Care) | Admitting: Hematology and Oncology

## 2016-12-03 ENCOUNTER — Inpatient Hospital Stay: Payer: Medicare (Managed Care)

## 2016-12-03 ENCOUNTER — Encounter: Payer: Self-pay | Admitting: Hematology and Oncology

## 2016-12-03 ENCOUNTER — Other Ambulatory Visit: Payer: Self-pay | Admitting: *Deleted

## 2016-12-03 VITALS — BP 133/94 | HR 57 | Temp 96.1°F | Resp 18 | Ht 70.0 in | Wt 184.1 lb

## 2016-12-03 DIAGNOSIS — Z87891 Personal history of nicotine dependence: Secondary | ICD-10-CM | POA: Diagnosis not present

## 2016-12-03 DIAGNOSIS — D693 Immune thrombocytopenic purpura: Secondary | ICD-10-CM

## 2016-12-03 DIAGNOSIS — D509 Iron deficiency anemia, unspecified: Secondary | ICD-10-CM | POA: Diagnosis not present

## 2016-12-03 DIAGNOSIS — D696 Thrombocytopenia, unspecified: Secondary | ICD-10-CM

## 2016-12-03 DIAGNOSIS — Z79899 Other long term (current) drug therapy: Secondary | ICD-10-CM

## 2016-12-03 DIAGNOSIS — Z7952 Long term (current) use of systemic steroids: Secondary | ICD-10-CM

## 2016-12-03 DIAGNOSIS — Z8042 Family history of malignant neoplasm of prostate: Secondary | ICD-10-CM

## 2016-12-03 DIAGNOSIS — Z801 Family history of malignant neoplasm of trachea, bronchus and lung: Secondary | ICD-10-CM

## 2016-12-03 DIAGNOSIS — Z9049 Acquired absence of other specified parts of digestive tract: Secondary | ICD-10-CM

## 2016-12-03 DIAGNOSIS — Z8041 Family history of malignant neoplasm of ovary: Secondary | ICD-10-CM

## 2016-12-03 DIAGNOSIS — Z87828 Personal history of other (healed) physical injury and trauma: Secondary | ICD-10-CM

## 2016-12-03 LAB — CBC WITH DIFFERENTIAL/PLATELET
Basophils Absolute: 0 10*3/uL (ref 0–0.1)
Basophils Relative: 1 %
Eosinophils Absolute: 0 10*3/uL (ref 0–0.7)
Eosinophils Relative: 1 %
HCT: 42 % (ref 40.0–52.0)
Hemoglobin: 14.1 g/dL (ref 13.0–18.0)
Lymphocytes Relative: 36 %
Lymphs Abs: 1.3 10*3/uL (ref 1.0–3.6)
MCH: 32.8 pg (ref 26.0–34.0)
MCHC: 33.5 g/dL (ref 32.0–36.0)
MCV: 97.7 fL (ref 80.0–100.0)
Monocytes Absolute: 0.3 10*3/uL (ref 0.2–1.0)
Monocytes Relative: 9 %
Neutro Abs: 1.9 10*3/uL (ref 1.4–6.5)
Neutrophils Relative %: 53 %
Platelets: 60 10*3/uL — ABNORMAL LOW (ref 150–400)
RBC: 4.3 MIL/uL — ABNORMAL LOW (ref 4.40–5.90)
RDW: 13.4 % (ref 11.5–14.5)
WBC: 3.5 10*3/uL — ABNORMAL LOW (ref 3.8–10.6)

## 2016-12-03 LAB — FERRITIN: Ferritin: 22 ng/mL — ABNORMAL LOW (ref 24–336)

## 2016-12-03 NOTE — Progress Notes (Signed)
[facility] day:  12/03/2016   Chief Complaint: Julian [name] is a 61 y.o. male with iron deficiency anemia and immune mediated thrombocytopenic purpura (ITP) who seen for 1 month assessment on a prednisone taper.  HPI:  The patient was last seen in the hematology clinic on 11/05/2016.  At that time, he denied any bruising or bleeding.  Hematocrit was 46.4 on oral iron.  Ferritin was 47.  Platelet count was 48,000.  He continueed prednisone 5 mg a day.  Elease Etienne (social work) was contacted regarding his hygiene issues.  He has had weekly platelet counts.  Platelet count was 66,000 on 11/12/2016, 78,000 on 11/19/2016, and 60,000 on 12/03/2016.  Prednisone was decreased to 5 mg QOD on 11/22/2016.  He is taking oral iron BID with orange juice.  Symptomatically, he is doing well.  He denies any bruising or bleeding.   Past Medical History:  Diagnosis Date  . Chronic ITP (idiopathic thrombocytopenia) (HCC)   . Iron deficiency anemia     Past Surgical History:  Procedure Laterality Date  . CHOLECYSTECTOMY      Family History  Problem Relation Age of Onset  . Hypertension Father   . Diabetes Father   . Lung cancer Brother   . Hypertension Maternal Grandmother   . Ovarian cancer Maternal Grandmother   . Diabetes Maternal Grandmother   . Prostate cancer Brother     Social History:  reports that he has quit smoking. He has never used smokeless tobacco. He reports that he does not drink alcohol. His drug history is not on file.  He previously smoked 1 pack per day.  He quit smoking this year.  He does not drink alcohol.  He does not take any non prescription drugs.  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  He denies any exposure to radiation or toxins.  He was in the [employer] for a few months.  He lives with his mom.  He has 1 son.  Hiis cousin, Denyce Robert, assists with his care.  Her contact numbers are: cell: [phone] and home:  [phone].  He is accompanied by the husband of his cousin, Denyce Robert.  He states that the patient is enrolled in the [facility] program.  Allergies: No Known Allergies  Current Medications: Current Outpatient Prescriptions  Medication Sig Dispense Refill  . donepezil (ARICEPT) 10 MG tablet Take 10 mg by mouth at bedtime.    . ferrous sulfate 325 (65 FE) MG tablet Take 325 mg by mouth daily with breakfast.    . gabapentin (NEURONTIN) 100 MG capsule Take 100 mg by mouth 2 (two) times daily.  11  . memantine (NAMENDA) 10 MG tablet Take by mouth.    Marland Kitchen omeprazole (PRILOSEC) 20 MG capsule Take 1 capsule (20 mg total) by mouth daily. 30 capsule 1  . predniSONE (DELTASONE) 5 MG tablet Take 5 mg (1 pill) alternating with 10 mg (2 pills) every other day. 30 tablet 1   No current facility-administered medications for this visit.     Review of Systems:  GENERAL:  Feels "fine".  No fevers or sweats.  Weight stable. PERFORMANCE STATUS (ECOG):  2 HEENT:  Possible glaucoma.  No visual changes, runny nose, sore throat, mouth sores or tenderness. Lungs: No shortness of breath or cough.  No hemoptysis. Cardiac:  No chest pain, palpitations, orthopnea, or PND. GI:  No nausea, vomiting, diarrhea, constipation, melena or hematochezia. GU:  No urgency, frequency, dysuria, or  hematuria. Musculoskeletal:  No back pain.  No joint pain.  No muscle tenderness. Extremities:  No pain or swelling. Skin:  No rashes or skin changes.  No bruising. Neuro:  No headache, numbness or weakness, balance or coordination issues. Endocrine:  No diabetes, thyroid issues, hot flashes or night sweats. Psych:  No mood changes, depression or anxiety. Pain:  No focal pain. Review of systems:  All other systems reviewed and found to be negative.  Physical Exam: Blood pressure (!) 133/94, pulse (!) 57, temperature (!) 96.1 F (35.6 C), temperature source Tympanic, resp. rate 18, height 5\' 10"  (1.778 m), weight 184 lb 1.4 oz (83.5  kg). GENERAL:  Chronically fatigued appearing gentleman sitting comfortably in the exam room in no acute distress.  He has gotten cleaned up.  He is wearing new clothes. MENTAL STATUS:  Alert and oriented to person, place and time. HEAD:  Short black hair.  Mustache.  Gray sideburns and beard.  Normocephalic, atraumatic, face symmetric, no Cushingoid features. EYES:  Glasses.  Brown eyes.  Pupils equal round and reactive to light and accomodation.  No conjunctivitis or scleral icterus. ENT:  Oropharynx clear without lesion.  Edentulous. Tongue normal. Mucous membranes moist.  RESPIRATORY:  Clear to auscultation without rales, wheezes or rhonchi. CARDIOVASCULAR:  Regular rate and rhythm without murmur, rub or gallop. ABDOMEN:  Soft, non-tender, with active bowel sounds, and no hepatosplenomegaly.  No masses. SKIN:  No rashes, ulcers or lesions. No petechiae. EXTREMITIES: Chronic lower extremity changes.  No skin discoloration or tenderness.  No palpable cords. LYMPH NODES: No palpable cervical, supraclavicular, axillary or inguinal adenopathy  NEUROLOGICAL: Unremarkable. PSYCH:  Appropriate.   Appointment on 12/03/2016  Component Date Value Ref Range Status  . WBC 12/03/2016 3.5* 3.8 - 10.6 K/uL Final  . RBC 12/03/2016 4.30* 4.40 - 5.90 MIL/uL Final  . Hemoglobin 12/03/2016 14.1  13.0 - 18.0 g/dL Final  . HCT 12/03/2016 42.0  40.0 - 52.0 % Final  . MCV 12/03/2016 97.7  80.0 - 100.0 fL Final  . MCH 12/03/2016 32.8  26.0 - 34.0 pg Final  . MCHC 12/03/2016 33.5  32.0 - 36.0 g/dL Final  . RDW 12/03/2016 13.4  11.5 - 14.5 % Final  . Platelets 12/03/2016 60* 150 - 400 K/uL Final   Comment: GIANT PLATELETS SEEN PLATELET COUNT CONFIRMED BY SMEAR   . Neutrophils Relative % 12/03/2016 53  % Final  . Neutro Abs 12/03/2016 1.9  1.4 - 6.5 K/uL Final  . Lymphocytes Relative 12/03/2016 36  % Final  . Lymphs Abs 12/03/2016 1.3  1.0 - 3.6 K/uL Final  . Monocytes Relative 12/03/2016 9  % Final  .  Monocytes Absolute 12/03/2016 0.3  0.2 - 1.0 K/uL Final  . Eosinophils Relative 12/03/2016 1  % Final  . Eosinophils Absolute 12/03/2016 0.0  0 - 0.7 K/uL Final  . Basophils Relative 12/03/2016 1  % Final  . Basophils Absolute 12/03/2016 0.0  0 - 0.1 K/uL Final  . Ferritin 12/03/2016 22* 24 - 336 ng/mL Final    Assessment:  Julian [name] is a 61 y.o. 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  Abdominal ultrasound on 05/11/2016 revealed a normal spleen and liver. Platelet count fluctuates (59,000 to 103,000).   He has iron deficiency anemia.  Diet appears good.  He denies any melena or hematochezia.  He denies any pica. Colonoscopy on 06/03/2014 revealed a tubular adenoma in the ascending colon negative for high grade dysplasia  and malignancy.  He declines guaiac cards.  He has been on oral iron x 5 months.    Ferritin has been followed: 30 on 05/28/2016, 26 on 06/25/2016, 29 on 08/06/2016, 90 on 10/08/2016, and 47 on 11/05/2016.  Work-up on 04/10/2016 revealed a hematocrit of 30.6, hemoglobin 9.1, MCV 69, platelets 88,000, WBC 5400 with an ANC of 2400.  Differential was unremarkable.  Negative studies included:  hepatitis B surface antigen, hepatitis B core antibody total, hepatitis C antibody, HIV antibody, ANA, and TSH.  Ferritin was 10 (low) with a TIBC of 457 (high) and a saturation of 43%.  Retic was 3.3%.  B12 and folate were normal on 06/25/2016.  Platelet count is as follows: 59,000 on 03/08/2016, 88,000 on 04/10/2016, 71,000 on 05/28/2016, 42,000 on 06/25/2016, 42,000 on 07/02/2016, 90,000 on 07/09/2016, 51,000 on 07/16/2016, 46,000 on 07/23/2016, 74,000 on 07/30/2016, 57,000 on 08/06/2016, 53,000 on 08/13/2016, 59,000 on 08/20/2016, 68,000 on 08/27/2016, 86,000 on 09/03/2016, 53,000 on 10/08/2016, 85,000 on 10/15/2016, 106,000 on 10/22/2016, 59,000 on 10/29/2016, 48,000 on 11/05/2016, 66,000 on 11/12/2016, 78,000 on  11/19/2016, and 60,000 on 12/03/2016.  He has a history of head trauma in the 1970s.  He has iron deficiency on oral iron BID.  He denies any melena or hematochezia.  Ferritin was 29 on 08/06/2016.  He began prednisone 40mg  a day on 07/02/2016.  He is currently on prednisone 5 mg QOD.  Symptomatically, he denies any bruising or bleeding.  Hematocrit is 42.0 on oral iron.  Platelet count is 60,000.  Ferritin is 22.  Plan: 1.  Labs today:  CBC with diff, ferritin. 2.  Discuss plan to taper off prednisone if cortisol level is good. 3.  Continue prednisone 5 mg QOD. 4.  RTC on 12/10/2016 at 8:30 AM for labs (CBC with diff, cortisol level) 5.  Call patient's family member with results. 6.  RTC on 12/24/2016 for CBC. 7.  RTC on 01/21/2017 for MD assessment and labs (CBC).   Lequita Asal, MD  12/03/2016, 12:17 PM

## 2016-12-03 NOTE — Progress Notes (Signed)
Pt doing well no bruising, no bleeding.

## 2016-12-10 ENCOUNTER — Inpatient Hospital Stay: Payer: Medicare (Managed Care)

## 2016-12-10 ENCOUNTER — Telehealth: Payer: Self-pay | Admitting: *Deleted

## 2016-12-10 DIAGNOSIS — D693 Immune thrombocytopenic purpura: Secondary | ICD-10-CM | POA: Diagnosis not present

## 2016-12-10 DIAGNOSIS — D696 Thrombocytopenia, unspecified: Secondary | ICD-10-CM

## 2016-12-10 LAB — CBC WITH DIFFERENTIAL/PLATELET
Basophils Absolute: 0 10*3/uL (ref 0–0.1)
Basophils Relative: 1 %
Eosinophils Absolute: 0 10*3/uL (ref 0–0.7)
Eosinophils Relative: 1 %
HCT: 45.6 % (ref 40.0–52.0)
Hemoglobin: 14.8 g/dL (ref 13.0–18.0)
Lymphocytes Relative: 42 %
Lymphs Abs: 1.8 10*3/uL (ref 1.0–3.6)
MCH: 31.9 pg (ref 26.0–34.0)
MCHC: 32.4 g/dL (ref 32.0–36.0)
MCV: 98.5 fL (ref 80.0–100.0)
Monocytes Absolute: 0.5 10*3/uL (ref 0.2–1.0)
Monocytes Relative: 12 %
Neutro Abs: 1.9 10*3/uL (ref 1.4–6.5)
Neutrophils Relative %: 44 %
Platelets: 71 10*3/uL — ABNORMAL LOW (ref 150–400)
RBC: 4.63 MIL/uL (ref 4.40–5.90)
RDW: 13.6 % (ref 11.5–14.5)
WBC: 4.3 10*3/uL (ref 3.8–10.6)

## 2016-12-10 LAB — CORTISOL: Cortisol, Plasma: 7.8 ug/dL

## 2016-12-10 NOTE — Telephone Encounter (Signed)
-----   Message from Lequita Asal, MD sent at 12/10/2016  2:57 PM EDT ----- Regarding: Please call patient  Is patient taking prednisone 5 mg QOD?  Did he hold the dose appropriately before cortisol?  M   ----- Message ----- From: Interface, Lab In [place] Sent: 12/10/2016  12:29 PM To: Lequita Asal, MD

## 2016-12-10 NOTE — Telephone Encounter (Signed)
Called Julian [name] and LVM to inquire if patient took his prednisone prior to having his labs drawn and to inquire how he is taking his prednisone?  Asked her to call us back.

## 2016-12-11 ENCOUNTER — Telehealth: Payer: Self-pay | Admitting: *Deleted

## 2016-12-11 NOTE — Telephone Encounter (Signed)
Notified caregiver, voiced understanding.

## 2016-12-11 NOTE — Telephone Encounter (Signed)
Called caregiver Denyce Robert and informed her that Mr. Ong should stop taking his prednisone because his labs were good per Dr. Mike Gip, voiced understanding.

## 2016-12-11 NOTE — Telephone Encounter (Signed)
-----   Message from Lequita Asal, MD sent at 12/10/2016  2:57 PM EDT ----- Regarding: Please call patient  Is patient taking prednisone 5 mg QOD?  Did he hold the dose appropriately before cortisol?  M   ----- Message ----- From: Interface, Lab In [place] Sent: 12/10/2016  12:29 PM To: Lequita Asal, MD

## 2016-12-11 NOTE — Telephone Encounter (Signed)
  Platelet count good.  OK to stop prednisone.  Continue lab checks as scheduled.  M

## 2016-12-11 NOTE — Telephone Encounter (Signed)
Called Julian [name], caretaker who verified that patient has been taking Prednisone 5 mg every other day.  He did not take it until after his lab draw.

## 2016-12-24 ENCOUNTER — Inpatient Hospital Stay: Payer: Medicare (Managed Care) | Attending: Hematology and Oncology

## 2016-12-24 DIAGNOSIS — D693 Immune thrombocytopenic purpura: Secondary | ICD-10-CM | POA: Diagnosis not present

## 2016-12-24 DIAGNOSIS — D696 Thrombocytopenia, unspecified: Secondary | ICD-10-CM

## 2016-12-24 LAB — CBC WITH DIFFERENTIAL/PLATELET
Basophils Absolute: 0 10*3/uL (ref 0–0.1)
Basophils Relative: 1 %
Eosinophils Absolute: 0 10*3/uL (ref 0–0.7)
Eosinophils Relative: 1 %
HCT: 41.6 % (ref 40.0–52.0)
Hemoglobin: 14 g/dL (ref 13.0–18.0)
Lymphocytes Relative: 46 %
Lymphs Abs: 2.1 10*3/uL (ref 1.0–3.6)
MCH: 32.8 pg (ref 26.0–34.0)
MCHC: 33.7 g/dL (ref 32.0–36.0)
MCV: 97.3 fL (ref 80.0–100.0)
Monocytes Absolute: 0.6 10*3/uL (ref 0.2–1.0)
Monocytes Relative: 14 %
Neutro Abs: 1.7 10*3/uL (ref 1.4–6.5)
Neutrophils Relative %: 38 %
Platelets: 52 10*3/uL — ABNORMAL LOW (ref 150–400)
RBC: 4.28 MIL/uL — ABNORMAL LOW (ref 4.40–5.90)
RDW: 13.1 % (ref 11.5–14.5)
WBC: 4.5 10*3/uL (ref 3.8–10.6)

## 2017-01-21 ENCOUNTER — Ambulatory Visit: Payer: Medicare (Managed Care) | Admitting: Hematology and Oncology

## 2017-01-21 ENCOUNTER — Other Ambulatory Visit: Payer: Medicare (Managed Care)

## 2017-02-05 ENCOUNTER — Inpatient Hospital Stay: Payer: Medicare (Managed Care) | Attending: Hematology and Oncology

## 2017-02-05 ENCOUNTER — Inpatient Hospital Stay (HOSPITAL_BASED_OUTPATIENT_CLINIC_OR_DEPARTMENT_OTHER): Payer: Medicare (Managed Care) | Admitting: Hematology and Oncology

## 2017-02-05 ENCOUNTER — Telehealth: Payer: Self-pay | Admitting: *Deleted

## 2017-02-05 VITALS — BP 146/84 | HR 58 | Temp 95.9°F | Resp 18 | Wt 186.2 lb

## 2017-02-05 DIAGNOSIS — Z801 Family history of malignant neoplasm of trachea, bronchus and lung: Secondary | ICD-10-CM | POA: Diagnosis not present

## 2017-02-05 DIAGNOSIS — Z8719 Personal history of other diseases of the digestive system: Secondary | ICD-10-CM

## 2017-02-05 DIAGNOSIS — Z87891 Personal history of nicotine dependence: Secondary | ICD-10-CM | POA: Insufficient documentation

## 2017-02-05 DIAGNOSIS — Z8041 Family history of malignant neoplasm of ovary: Secondary | ICD-10-CM | POA: Diagnosis not present

## 2017-02-05 DIAGNOSIS — D693 Immune thrombocytopenic purpura: Secondary | ICD-10-CM

## 2017-02-05 DIAGNOSIS — Z87828 Personal history of other (healed) physical injury and trauma: Secondary | ICD-10-CM

## 2017-02-05 DIAGNOSIS — Z79899 Other long term (current) drug therapy: Secondary | ICD-10-CM | POA: Insufficient documentation

## 2017-02-05 DIAGNOSIS — D509 Iron deficiency anemia, unspecified: Secondary | ICD-10-CM | POA: Insufficient documentation

## 2017-02-05 DIAGNOSIS — D696 Thrombocytopenia, unspecified: Secondary | ICD-10-CM

## 2017-02-05 LAB — CBC WITH DIFFERENTIAL/PLATELET
Basophils Absolute: 0 10*3/uL (ref 0–0.1)
Basophils Relative: 1 %
Eosinophils Absolute: 0 10*3/uL (ref 0–0.7)
Eosinophils Relative: 1 %
HCT: 44.6 % (ref 40.0–52.0)
Hemoglobin: 14.9 g/dL (ref 13.0–18.0)
Lymphocytes Relative: 49 %
Lymphs Abs: 1.9 10*3/uL (ref 1.0–3.6)
MCH: 32.6 pg (ref 26.0–34.0)
MCHC: 33.4 g/dL (ref 32.0–36.0)
MCV: 97.4 fL (ref 80.0–100.0)
Monocytes Absolute: 0.5 10*3/uL (ref 0.2–1.0)
Monocytes Relative: 12 %
Neutro Abs: 1.4 10*3/uL (ref 1.4–6.5)
Neutrophils Relative %: 37 %
Platelets: 56 10*3/uL — ABNORMAL LOW (ref 150–400)
RBC: 4.57 MIL/uL (ref 4.40–5.90)
RDW: 12.9 % (ref 11.5–14.5)
WBC: 3.9 10*3/uL (ref 3.8–10.6)

## 2017-02-05 LAB — FERRITIN: Ferritin: 15 ng/mL — ABNORMAL LOW (ref 24–336)

## 2017-02-05 NOTE — Telephone Encounter (Signed)
Called Denyce Robert, caretaker for patient and informed her per MD to have patient continue oral iron.  Verbalized understanding.

## 2017-02-05 NOTE — Patient Instructions (Signed)
Eltrombopag tablets  What is this medicine?  ELTROMBOPAG (el TROM boe pag) helps your body make more platelets. It is used to treat low platelets caused by chronic immune (idiopathic) thrombocytopenic purpura (ITP) or chronic hepatitis C infection. It is also used in patients with severe aplastic anemia when other medicines have not worked well enough.  This medicine may be used for other purposes; ask your health care provider or pharmacist if you have questions.  COMMON BRAND NAME(S): Promacta  What should I tell my health care provider before I take this medicine?  They need to know if you have any of these conditions:  -cancer  -history of blood clots  -eye disease, vision problems  -kidney disease  -liver disease  -low blood counts, like low white cell, platelet, or red cell counts  -have had your spleen removed  -an unusual or allergic reaction to Eltrombopag, other medicines, foods, dyes, or preservatives  -pregnant or trying to get pregnant  -breast-feeding  How should I use this medicine?  Take this medicine by mouth with a glass of water. Follow the directions on the prescription label. Take this medicine on an empty stomach, at least 1 hour before or 2 hours after food. Do not take with food. Avoid antacids, aluminum, calcium, iron, magnesium, selenium, and zinc products for 2 hours before and 4 hours after taking your dose. Take your medicine at regular intervals. Do not take it more often than directed. Do not stop taking except on your doctor's advice.  A special MedGuide will be given to you by the pharmacist with each prescription and refill. Be sure to read this information carefully each time.  Talk to your pediatrician regarding the use of this medicine in children. While this drug may be prescribed for children as young as 1 year for selected conditions, precautions do apply.  Overdosage: If you think you have taken too much of this medicine contact a poison control center or emergency room at  once.  NOTE: This medicine is only for you. Do not share this medicine with others.  What if I miss a dose?  If you miss a dose, wait and take your next scheduled dose. Do not take more than 1 dose in 1 day. If it is almost time for your next dose, take only that dose. Do not take double or extra doses.  What may interact with this medicine?  -antacids  -bosentan  -calcium supplements  -certain medicines for cholesterol like atorvastatin, fluvastatin, pravastatin, rosuvastatin  -certain medicines that treat or prevent blood clots like warfarin, enoxaparin, dalteparin, apixaban, dabigatran, and rivaroxaban  -ezetimibe  -glyburide  -imatinib  -irinotecan  -iron supplements  -lapatinib  -magnesium supplements  -methotrexate  -mitoxantrone  -multivitamins with minerals  -NSAIDS, medicines for pain and inflammation, like ibuprofen or naproxen  -olmesartan  -omeprazole  -repaglinide  -rifampin  -selenium  -sulfasalazine  -topotecan  -valsartan  -zinc  This list may not describe all possible interactions. Give your health care provider a list of all the medicines, herbs, non-prescription drugs, or dietary supplements you use. Also tell them if you smoke, drink alcohol, or use illegal drugs. Some items may interact with your medicine.  What should I watch for while using this medicine?  Your condition will be monitored carefully while you are receiving this medicine. To receive this medicine, you, your doctor and your pharmacy must be registered in the [employer] program.  Visit your prescriber or health care professional for regular checks on   professional as soon as possible: -allergic reactions like skin  rash, itching or hives, swelling of the face, lips, or tongue -changes in vision -dark urine -general ill feeling or flu-like symptoms -light-colored stools -loss of appetite -right upper belly pain -signs and symptoms of a blood clot such as breathing problems; chest pain; severe, sudden headache; pain, swelling, warmth in the leg; trouble speaking; sudden numbness or weakness of the face, arm or leg -shortness of breath, chest pain, swelling in a leg -unusual bleeding or bruising -unusually weak or tired -yellowing of the eyes or skin Side effects that usually do not require medical attention (report to your doctor or health care professional if they continue or are bothersome): -cough -diarrhea -dry mouth -headache -muscle aches -nausea This list may not describe all possible side effects. Call your doctor for medical advice about side effects. You may report side effects to FDA at 1-800-FDA-1088. Where should I keep my medicine? Keep out of the reach of children. Store at room temperature between 15 and 30 degrees C (59 and 86 degrees F). Throw away any unused medicine after the expiration date. NOTE: This sheet is a summary. It may not cover all possible information. If you have questions about this medicine, talk to your doctor, pharmacist, or health care provider.  2018 Elsevier/Gold Standard (2014-05-17 11:13:20) Romiplostim injection What is this medicine? ROMIPLOSTIM (roe mi PLOE stim) helps your body make more platelets. This medicine is used to treat low platelets caused by chronic idiopathic thrombocytopenic purpura (ITP). This medicine may be used for other purposes; ask your health care provider or pharmacist if you have questions. COMMON BRAND NAME(S): Nplate What should I tell my health care provider before I take this medicine? They need to know if you have any of these conditions: -cancer or myelodysplastic syndrome -low blood counts, like low white cell,  platelet, or red cell counts -take medicines that treat or prevent blood clots -an unusual or allergic reaction to romiplostim, mannitol, other medicines, foods, dyes, or preservatives -pregnant or trying to get pregnant -breast-feeding How should I use this medicine? This medicine is for injection under the skin. It is given by a health care professional in a hospital or clinic setting. A special MedGuide will be given to you before your injection. Read this information carefully each time. Talk to your pediatrician regarding the use of this medicine in children. Special care may be needed. Overdosage: If you think you have taken too much of this medicine contact a poison control center or emergency room at once. NOTE: This medicine is only for you. Do not share this medicine with others. What if I miss a dose? It is important not to miss your dose. Call your doctor or health care professional if you are unable to keep an appointment. What may interact with this medicine? Interactions are not expected. This list may not describe all possible interactions. Give your health care provider a list of all the medicines, herbs, non-prescription drugs, or dietary supplements you use. Also tell them if you smoke, drink alcohol, or use illegal drugs. Some items may interact with your medicine. What should I watch for while using this medicine? Your condition will be monitored carefully while you are receiving this medicine. Visit your prescriber or health care professional for regular checks on your progress and for the needed blood tests. It is important to keep all appointments. What side effects may I notice from receiving this medicine? Side effects that you should report to  your doctor or health care professional as soon as possible: -allergic reactions like skin rash, itching or hives, swelling of the face, lips, or tongue -shortness of breath, chest pain, swelling in a leg -unusual bleeding or  bruising Side effects that usually do not require medical attention (report to your doctor or health care professional if they continue or are bothersome): -dizziness -headache -muscle aches -pain in arms and legs -stomach pain -trouble sleeping This list may not describe all possible side effects. Call your doctor for medical advice about side effects. You may report side effects to FDA at 1-800-FDA-1088. Where should I keep my medicine? This drug is given in a hospital or clinic and will not be stored at home. NOTE: This sheet is a summary. It may not cover all possible information. If you have questions about this medicine, talk to your doctor, pharmacist, or health care provider.  2018 Elsevier/Gold Standard (2008-05-03 15:13:04) Rituximab injection What is this medicine? RITUXIMAB (ri TUX i mab) is a monoclonal antibody. It is used to treat certain types of cancer like non-Hodgkin lymphoma and chronic lymphocytic leukemia. It is also used to treat rheumatoid arthritis, granulomatosis with polyangiitis (or Wegener's granulomatosis), and microscopic polyangiitis. This medicine may be used for other purposes; ask your health care provider or pharmacist if you have questions. COMMON BRAND NAME(S): Rituxan What should I tell my health care provider before I take this medicine? They need to know if you have any of these conditions: -heart disease -infection (especially a virus infection such as hepatitis B, chickenpox, cold sores, or herpes) -immune system problems -irregular heartbeat -kidney disease -lung or breathing disease, like asthma -recently received or scheduled to receive a vaccine -an unusual or allergic reaction to rituximab, mouse proteins, other medicines, foods, dyes, or preservatives -pregnant or trying to get pregnant -breast-feeding How should I use this medicine? This medicine is for infusion into a vein. It is administered in a hospital or clinic by a specially  trained health care professional. A special MedGuide will be given to you by the pharmacist with each prescription and refill. Be sure to read this information carefully each time. Talk to your pediatrician regarding the use of this medicine in children. This medicine is not approved for use in children. Overdosage: If you think you have taken too much of this medicine contact a poison control center or emergency room at once. NOTE: This medicine is only for you. Do not share this medicine with others. What if I miss a dose? It is important not to miss a dose. Call your doctor or health care professional if you are unable to keep an appointment. What may interact with this medicine? -cisplatin -other medicines for arthritis like disease modifying antirheumatic drugs or tumor necrosis factor inhibitors -live virus vaccines This list may not describe all possible interactions. Give your health care provider a list of all the medicines, herbs, non-prescription drugs, or dietary supplements you use. Also tell them if you smoke, drink alcohol, or use illegal drugs. Some items may interact with your medicine. What should I watch for while using this medicine? Your condition will be monitored carefully while you are receiving this medicine. You may need blood work done while you are taking this medicine. This medicine can cause serious allergic reactions. To reduce your risk you may need to take medicine before treatment with this medicine. Take your medicine as directed. In some patients, this medicine may cause a serious brain infection that may cause death. If  you have any problems seeing, thinking, speaking, walking, or standing, tell your doctor right away. If you cannot reach your doctor, urgently seek other source of medical care. Call your doctor or health care professional for advice if you get a fever, chills or sore throat, or other symptoms of a cold or flu. Do not treat yourself. This drug  decreases your body's ability to fight infections. Try to avoid being around people who are sick. Do not become pregnant while taking this medicine or for 12 months after stopping it. Women should inform their doctor if they wish to become pregnant or think they might be pregnant. There is a potential for serious side effects to an unborn child. Talk to your health care professional or pharmacist for more information. What side effects may I notice from receiving this medicine? Side effects that you should report to your doctor or health care professional as soon as possible: -breathing problems -chest pain -dizziness or feeling faint -fast, irregular heartbeat -low blood counts - this medicine may decrease the number of white blood cells, red blood cells and platelets. You may be at increased risk for infections and bleeding. -mouth sores -redness, blistering, peeling or loosening of the skin, including inside the mouth (this can be added for any serious or exfoliative rash that could lead to hospitalization) -signs of infection - fever or chills, cough, sore throat, pain or difficulty passing urine -signs and symptoms of kidney injury like trouble passing urine or change in the amount of urine -signs and symptoms of liver injury like dark yellow or brown urine; general ill feeling or flu-like symptoms; light-colored stools; loss of appetite; nausea; right upper belly pain; unusually weak or tired; yellowing of the eyes or skin -stomach pain -vomiting Side effects that usually do not require medical attention (report to your doctor or health care professional if they continue or are bothersome): -headache -joint pain -muscle cramps or muscle pain This list may not describe all possible side effects. Call your doctor for medical advice about side effects. You may report side effects to FDA at 1-800-FDA-1088. Where should I keep my medicine? This drug is given in a hospital or clinic and will  not be stored at home. NOTE: This sheet is a summary. It may not cover all possible information. If you have questions about this medicine, talk to your doctor, pharmacist, or health care provider.  2018 Elsevier/Gold Standard (2016-04-11 15:28:09)

## 2017-02-05 NOTE — Progress Notes (Signed)
Patient offers no complaints today. 

## 2017-02-05 NOTE — Telephone Encounter (Signed)
-----   Message from Lequita Asal, MD sent at 02/05/2017  1:28 PM EDT ----- Regarding: Please call patient  Continue oral iron.   ----- Message ----- From: Interface, Lab In [place] Sent: 02/05/2017  11:09 AM To: Lequita Asal, MD

## 2017-02-05 NOTE — Progress Notes (Signed)
[facility] day:  02/05/2017   Chief Complaint: Julian [name] is a 61 y.o. male with iron deficiency anemia and immune mediated thrombocytopenic purpura (ITP) who seen for 2 month assessment.  HPI:  The patient was last seen in the hematology clinic on 12/03/2016.  At that time, he denied any bruising or bleeding.  Hematocrit was 42.0 on oral iron.  Platelet count was 60,000.  Ferritin was 22.  He continued prednisone 5 mg QOD.    Prednisone was discontinued on 12/11/2016.  Platelet count was 52,000 on 12/24/2016.  During the interim, he has felt good.  He denies any bruising or bleeding.  He last had a colonoscopy on 06/03/2014 at [facility] (no results available).   Past Medical History:  Diagnosis Date  . Chronic ITP (idiopathic thrombocytopenia) (HCC)   . Iron deficiency anemia     Past Surgical History:  Procedure Laterality Date  . CHOLECYSTECTOMY      Family History  Problem Relation Age of Onset  . Hypertension Father   . Diabetes Father   . Lung cancer Brother   . Hypertension Maternal Grandmother   . Ovarian cancer Maternal Grandmother   . Diabetes Maternal Grandmother   . Prostate cancer Brother     Social History:  reports that he has quit smoking. He has never used smokeless tobacco. He reports that he does not drink alcohol. His drug history is not on file.  He previously smoked 1 pack per day.  He quit smoking this year.  He does not drink alcohol.  He does not take any non prescription drugs.  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  He denies any exposure to radiation or toxins.  He was in the [employer] for a few months.  He lives with his mom.  He has 1 son.  Hiis cousin, Denyce Robert, assists with his care.  Her contact numbers are: cell: [phone] and home: [phone].  He is accompanied by a neighbor/cousin, Bud.  He is usually accompanied by his cousin, Denyce Robert.  He states that the patient is enrolled in the  [employer] program.  Allergies: No Known Allergies  Current Medications: Current Outpatient Prescriptions  Medication Sig Dispense Refill  . donepezil (ARICEPT) 10 MG tablet Take 10 mg by mouth at bedtime.    . ferrous sulfate 325 (65 FE) MG tablet Take 325 mg by mouth daily with breakfast.    . gabapentin (NEURONTIN) 100 MG capsule Take 100 mg by mouth 2 (two) times daily.  11  . memantine (NAMENDA) 10 MG tablet Take by mouth.    Marland Kitchen omeprazole (PRILOSEC) 20 MG capsule Take 1 capsule (20 mg total) by mouth daily. 30 capsule 1  . predniSONE (DELTASONE) 5 MG tablet Take 5 mg (1 pill) alternating with 10 mg (2 pills) every other day. (Patient not taking: Reported on 02/05/2017) 30 tablet 1   No current facility-administered medications for this visit.     Review of Systems:  GENERAL:  Feels "good".  No fevers or sweats.  Weight up 2 pounds. PERFORMANCE STATUS (ECOG):  2 HEENT:  Possible glaucoma.  No visual changes, runny nose, sore throat, mouth sores or tenderness. Lungs: No shortness of breath or cough.  No hemoptysis. Cardiac:  No chest pain, palpitations, orthopnea, or PND. GI:  No nausea, vomiting, diarrhea, constipation, melena or hematochezia.  Colonoscopy 06/03/2014 at [facility]. GU:  No urgency, frequency, dysuria, or hematuria. Musculoskeletal:  No back pain.  No  joint pain.  No muscle tenderness. Extremities:  No pain or swelling. Skin:  No rashes or skin changes.  No bruising. Neuro:  No headache, numbness or weakness, balance or coordination issues. Endocrine:  No diabetes, thyroid issues, hot flashes or night sweats. Psych:  No mood changes, depression or anxiety. Pain:  No focal pain. Review of systems:  All other systems reviewed and found to be negative.  Physical Exam: Blood pressure (!) 146/84, pulse (!) 58, temperature (!) 95.9 F (35.5 C), temperature source Tympanic, resp. rate 18, weight 186 lb 3 oz (84.5 kg). GENERAL:  Chronically fatigued appearing gentleman sitting  comfortably in the exam room in no acute distress.  MENTAL STATUS:  Alert and oriented to person, place and time. HEAD:  Short black hair.  Mustache.  Gray sideburns and beard.  Normocephalic, atraumatic, face symmetric, no Cushingoid features. EYES:  Glasses.  Brown eyes.  Pupils equal round and reactive to light and accomodation.  No conjunctivitis or scleral icterus. ENT:  Oropharynx clear without lesion.  Edentulous. Tongue normal. Mucous membranes moist.  RESPIRATORY:  Clear to auscultation without rales, wheezes or rhonchi. CARDIOVASCULAR:  Regular rate and rhythm without murmur, rub or gallop. ABDOMEN:  Soft, non-tender, with active bowel sounds, and no hepatosplenomegaly.  No masses. SKIN:  No rashes, ulcers or lesions. No petechiae. EXTREMITIES: Chronic lower extremity changes.  No skin discoloration or tenderness.  No palpable cords. LYMPH NODES: No palpable cervical, supraclavicular, axillary or inguinal adenopathy  NEUROLOGICAL: Unremarkable. PSYCH:  Appropriate.   Appointment on 02/05/2017  Component Date Value Ref Range Status  . WBC 02/05/2017 3.9  3.8 - 10.6 K/uL Final  . RBC 02/05/2017 4.57  4.40 - 5.90 MIL/uL Final  . Hemoglobin 02/05/2017 14.9  13.0 - 18.0 g/dL Final  . HCT 02/05/2017 44.6  40.0 - 52.0 % Final  . MCV 02/05/2017 97.4  80.0 - 100.0 fL Final  . MCH 02/05/2017 32.6  26.0 - 34.0 pg Final  . MCHC 02/05/2017 33.4  32.0 - 36.0 g/dL Final  . RDW 02/05/2017 12.9  11.5 - 14.5 % Final  . Platelets 02/05/2017 56* 150 - 400 K/uL Final   Comment: RESULT REPEATED AND VERIFIED PLATELET COUNT CONFIRMED BY SMEAR GIANT PLATELETS SEEN   . Neutrophils Relative % 02/05/2017 37  % Final  . Neutro Abs 02/05/2017 1.4  1.4 - 6.5 K/uL Final  . Lymphocytes Relative 02/05/2017 49  % Final  . Lymphs Abs 02/05/2017 1.9  1.0 - 3.6 K/uL Final  . Monocytes Relative 02/05/2017 12  % Final  . Monocytes Absolute 02/05/2017 0.5  0.2 - 1.0 K/uL Final  . Eosinophils Relative 02/05/2017  1  % Final  . Eosinophils Absolute 02/05/2017 0.0  0 - 0.7 K/uL Final  . Basophils Relative 02/05/2017 1  % Final  . Basophils Absolute 02/05/2017 0.0  0 - 0.1 K/uL Final    Assessment:  Julian [name] is a 61 y.o. 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  Abdominal ultrasound on 05/11/2016 revealed a normal spleen and liver. Platelet count fluctuates (59,000 to 103,000).   He has iron deficiency anemia.  Diet appears good.  He denies any melena or hematochezia.  He denies any pica. Colonoscopy on 06/03/2014 revealed a tubular adenoma in the ascending colon negative for high grade dysplasia and malignancy.  He declines guaiac cards.  He has been on oral iron x 5 months.    Ferritin has been followed: 30 on 05/28/2016, 26  on 06/25/2016, 29 on 08/06/2016, 90 on 10/08/2016, and 47 on 11/05/2016.  Work-up on 04/10/2016 revealed a hematocrit of 30.6, hemoglobin 9.1, MCV 69, platelets 88,000, WBC 5400 with an ANC of 2400.  Differential was unremarkable.  Negative studies included:  hepatitis B surface antigen, hepatitis B core antibody total, hepatitis C antibody, HIV antibody, ANA, and TSH.  Ferritin was 10 (low) with a TIBC of 457 (high) and a saturation of 43%.  Retic was 3.3%.  B12 and folate were normal on 06/25/2016.  Platelet count is as follows: 59,000 on 03/08/2016, 88,000 on 04/10/2016, 71,000 on 05/28/2016, 42,000 on 06/25/2016, 42,000 on 07/02/2016, 90,000 on 07/09/2016, 51,000 on 07/16/2016, 46,000 on 07/23/2016, 74,000 on 07/30/2016, 57,000 on 08/06/2016, 53,000 on 08/13/2016, 59,000 on 08/20/2016, 68,000 on 08/27/2016, 86,000 on 09/03/2016, 53,000 on 10/08/2016, 85,000 on 10/15/2016, 106,000 on 10/22/2016, 59,000 on 10/29/2016, 48,000 on 11/05/2016, 66,000 on 11/12/2016, 78,000 on 11/19/2016, 60,000 on 12/03/2016, 71,000 on 12/10/2016, 52,000 on 12/24/2016, and 56,000 on 02/05/2017.  He has a history of head trauma in the  1970s.  He has iron deficiency on oral iron BID.  He denies any melena or hematochezia.  Ferritin was 22 on 12/03/2016.  He began prednisone 40mg  a day on 07/02/2016.  Prednisone was discontinued on 12/11/2016.  Symptomatically, he denies any bruising or bleeding.  Hematocrit is 44.6 on oral iron.  Platelet count is 56,000.  Ferritin is 15.  Plan: 1.  Labs today:  CBC with diff, ferritin. 2.  Discuss iron deficiency and last colonoscopy.  Obtain copy of colonoscopy from [facility] on 06/03/2014. 3.  Discuss adequate platelet count.  No intervention needed at this time.  Discuss potential treatment options besides prednisone (Rituxan, Promacta, Nplate).  Information provided.  Discuss ongoing surveillance.  Patient to call if any increased bruising or bleeding. 4.  RTC in 1 month for MD assessment and labs (CBC with diff, hepatitis B core antibody total, hepatitis B surface antigen, hepatitis C antibody).   Lequita Asal, MD  02/05/2017, 11:22 AM

## 2017-03-08 ENCOUNTER — Inpatient Hospital Stay: Payer: PRIVATE HEALTH INSURANCE | Admitting: Hematology and Oncology

## 2017-03-08 ENCOUNTER — Inpatient Hospital Stay: Payer: PRIVATE HEALTH INSURANCE

## 2017-03-08 NOTE — Progress Notes (Unsigned)
[facility] day:  03/08/2017   Chief Complaint: Julian [name] is a 61 y.o. male with iron deficiency anemia and immune mediated thrombocytopenic purpura (ITP) who seen for 1 month assessment.  HPI:  The patient was last seen in the hematology clinic on 02/05/2017.  At that time, he denied any bruising or bleeding.  Hematocrit was 44.6 on oral iron.  Platelet count was 56,000.  Ferritin was 15.   During the interim,   Past Medical History:  Diagnosis Date  . Chronic ITP (idiopathic thrombocytopenia) (HCC)   . Iron deficiency anemia     Past Surgical History:  Procedure Laterality Date  . CHOLECYSTECTOMY      Family History  Problem Relation Age of Onset  . Hypertension Father   . Diabetes Father   . Lung cancer Brother   . Hypertension Maternal Grandmother   . Ovarian cancer Maternal Grandmother   . Diabetes Maternal Grandmother   . Prostate cancer Brother     Social History:  reports that he has quit smoking. He has never used smokeless tobacco. He reports that he does not drink alcohol. His drug history is not on file.  He previously smoked 1 pack per day.  He quit smoking this year.  He does not drink alcohol.  He does not take any non prescription drugs.  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  He denies any exposure to radiation or toxins.  He was in the [employer] for a few months.  He lives with his mom.  He has 1 son.  Hiis cousin, Denyce Robert, assists with his care.  Her contact numbers are: cell: [phone] and home: [phone].  He is accompanied by the husband of his cousin, Denyce Robert.  He states that the patient is enrolled in the [facility] program.  Allergies: No Known Allergies  Current Medications: Current Outpatient Prescriptions  Medication Sig Dispense Refill  . donepezil (ARICEPT) 10 MG tablet Take 10 mg by mouth at bedtime.    . ferrous sulfate 325 (65 FE) MG tablet Take 325 mg by mouth daily with  breakfast.    . gabapentin (NEURONTIN) 100 MG capsule Take 100 mg by mouth 2 (two) times daily.  11  . memantine (NAMENDA) 10 MG tablet Take by mouth.    Marland Kitchen omeprazole (PRILOSEC) 20 MG capsule Take 1 capsule (20 mg total) by mouth daily. 30 capsule 1  . predniSONE (DELTASONE) 5 MG tablet Take 5 mg (1 pill) alternating with 10 mg (2 pills) every other day. (Patient not taking: Reported on 02/05/2017) 30 tablet 1   No current facility-administered medications for this visit.     Review of Systems:  GENERAL:  Feels "fine".  No fevers or sweats.  Weight stable. PERFORMANCE STATUS (ECOG):  2 HEENT:  Possible glaucoma.  No visual changes, runny nose, sore throat, mouth sores or tenderness. Lungs: No shortness of breath or cough.  No hemoptysis. Cardiac:  No chest pain, palpitations, orthopnea, or PND. GI:  No nausea, vomiting, diarrhea, constipation, melena or hematochezia. GU:  No urgency, frequency, dysuria, or hematuria. Musculoskeletal:  No back pain.  No joint pain.  No muscle tenderness. Extremities:  No pain or swelling. Skin:  No rashes or skin changes.  No bruising. Neuro:  No headache, numbness or weakness, balance or coordination issues. Endocrine:  No diabetes, thyroid issues, hot flashes or night sweats. Psych:  No mood changes, depression or anxiety. Pain:  No focal pain. Review  of systems:  All other systems reviewed and found to be negative.  Physical Exam: There were no vitals taken for this visit. GENERAL:  Chronically fatigued appearing gentleman sitting comfortably in the exam room in no acute distress.  He has gotten cleaned up.  He is wearing new clothes. MENTAL STATUS:  Alert and oriented to person, place and time. HEAD:  Short black hair.  Mustache.  Gray sideburns and beard.  Normocephalic, atraumatic, face symmetric, no Cushingoid features. EYES:  Glasses.  Brown eyes.  Pupils equal round and reactive to light and accomodation.  No conjunctivitis or scleral  icterus. ENT:  Oropharynx clear without lesion.  Edentulous. Tongue normal. Mucous membranes moist.  RESPIRATORY:  Clear to auscultation without rales, wheezes or rhonchi. CARDIOVASCULAR:  Regular rate and rhythm without murmur, rub or gallop. ABDOMEN:  Soft, non-tender, with active bowel sounds, and no hepatosplenomegaly.  No masses. SKIN:  No rashes, ulcers or lesions. No petechiae. EXTREMITIES: Chronic lower extremity changes.  No skin discoloration or tenderness.  No palpable cords. LYMPH NODES: No palpable cervical, supraclavicular, axillary or inguinal adenopathy  NEUROLOGICAL: Unremarkable. PSYCH:  Appropriate.   No visits with results within 3 Day(s) from this visit.  Latest known visit with results is:  Appointment on 02/05/2017  Component Date Value Ref Range Status  . WBC 02/05/2017 3.9  3.8 - 10.6 K/uL Final  . RBC 02/05/2017 4.57  4.40 - 5.90 MIL/uL Final  . Hemoglobin 02/05/2017 14.9  13.0 - 18.0 g/dL Final  . HCT 02/05/2017 44.6  40.0 - 52.0 % Final  . MCV 02/05/2017 97.4  80.0 - 100.0 fL Final  . MCH 02/05/2017 32.6  26.0 - 34.0 pg Final  . MCHC 02/05/2017 33.4  32.0 - 36.0 g/dL Final  . RDW 02/05/2017 12.9  11.5 - 14.5 % Final  . Platelets 02/05/2017 56* 150 - 400 K/uL Final   Comment: RESULT REPEATED AND VERIFIED PLATELET COUNT CONFIRMED BY SMEAR GIANT PLATELETS SEEN   . Neutrophils Relative % 02/05/2017 37  % Final  . Neutro Abs 02/05/2017 1.4  1.4 - 6.5 K/uL Final  . Lymphocytes Relative 02/05/2017 49  % Final  . Lymphs Abs 02/05/2017 1.9  1.0 - 3.6 K/uL Final  . Monocytes Relative 02/05/2017 12  % Final  . Monocytes Absolute 02/05/2017 0.5  0.2 - 1.0 K/uL Final  . Eosinophils Relative 02/05/2017 1  % Final  . Eosinophils Absolute 02/05/2017 0.0  0 - 0.7 K/uL Final  . Basophils Relative 02/05/2017 1  % Final  . Basophils Absolute 02/05/2017 0.0  0 - 0.1 K/uL Final  . Ferritin 02/05/2017 15* 24 - 336 ng/mL Final    Assessment:  Julian [name] is a 61  y.o. 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  Abdominal ultrasound on 05/11/2016 revealed a normal spleen and liver. Platelet count fluctuates (59,000 to 103,000).   He has iron deficiency anemia.  Diet appears good.  He denies any melena or hematochezia.  He denies any pica. Colonoscopy on 06/03/2014 revealed a tubular adenoma in the ascending colon negative for high grade dysplasia and malignancy.  He declines guaiac cards.  He has been on oral iron x 5 months.    Ferritin has been followed: 30 on 05/28/2016, 26 on 06/25/2016, 29 on 08/06/2016, 90 on 10/08/2016, and 47 on 11/05/2016.  Work-up on 04/10/2016 revealed a hematocrit of 30.6, hemoglobin 9.1, MCV 69, platelets 88,000, WBC 5400 with an ANC of 2400.  Differential was  unremarkable.  Negative studies included:  hepatitis B surface antigen, hepatitis B core antibody total, hepatitis C antibody, HIV antibody, ANA, and TSH.  Ferritin was 10 (low) with a TIBC of 457 (high) and a saturation of 43%.  Retic was 3.3%.  B12 and folate were normal on 06/25/2016.  Platelet count is as follows: 59,000 on 03/08/2016, 88,000 on 04/10/2016, 71,000 on 05/28/2016, 42,000 on 06/25/2016, 42,000 on 07/02/2016, 90,000 on 07/09/2016, 51,000 on 07/16/2016, 46,000 on 07/23/2016, 74,000 on 07/30/2016, 57,000 on 08/06/2016, 53,000 on 08/13/2016, 59,000 on 08/20/2016, 68,000 on 08/27/2016, 86,000 on 09/03/2016, 53,000 on 10/08/2016, 85,000 on 10/15/2016, 106,000 on 10/22/2016, 59,000 on 10/29/2016, 48,000 on 11/05/2016, 66,000 on 11/12/2016, 78,000 on 11/19/2016, 60,000 on 12/03/2016, 71,000 on 12/10/2016, 52,000 on 12/24/2016, and 56,000 on 02/05/2017.  He has a history of head trauma in the 1970s.  He has iron deficiency on oral iron BID.  He denies any melena or hematochezia.  Ferritin was 22 on 12/03/2016.  He began prednisone 40mg  a day on 07/02/2016.  Prednisone was discontinued on  12/11/2016.  Symptomatically, he denies any bruising or bleeding.  Hematocrit is 42.0 on oral iron.  Platelet count is 60,000.  Ferritin is 22.  Plan: 1.  Labs today:  CBC with diff, hepatitis B core antibody total, hepatitis B surface antigen, hepatitis C antibody, ferritin. 2.  Ask about colonoscopy.   Lequita Asal, MD  03/08/2017, 6:01 AM

## 2017-03-09 ENCOUNTER — Encounter: Payer: Self-pay | Admitting: Hematology and Oncology

## 2017-04-05 ENCOUNTER — Inpatient Hospital Stay: Payer: PRIVATE HEALTH INSURANCE | Admitting: Hematology and Oncology

## 2017-04-05 ENCOUNTER — Inpatient Hospital Stay: Payer: PRIVATE HEALTH INSURANCE
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
## 2017-05-06 ENCOUNTER — Inpatient Hospital Stay: Payer: PRIVATE HEALTH INSURANCE | Admitting: Hematology and Oncology

## 2017-05-06 ENCOUNTER — Inpatient Hospital Stay: Payer: PRIVATE HEALTH INSURANCE

## 2017-05-08 ENCOUNTER — Encounter: Payer: Self-pay | Admitting: *Deleted

## 2020-02-22 ENCOUNTER — Emergency Department
Admission: EM | Admit: 2020-02-22 | Discharge: 2020-02-22 | Disposition: A | Discharge status: Home / Self Care | Payer: Medicare (Managed Care) | Attending: Emergency Medicine | Admitting: Emergency Medicine

## 2020-02-22 ENCOUNTER — Encounter: Payer: Self-pay | Admitting: Emergency Medicine

## 2020-02-22 ENCOUNTER — Emergency Department: Payer: Medicare (Managed Care)

## 2020-02-22 ENCOUNTER — Other Ambulatory Visit: Payer: Self-pay

## 2020-02-22 DIAGNOSIS — F0390 Unspecified dementia without behavioral disturbance: Secondary | ICD-10-CM | POA: Diagnosis not present

## 2020-02-22 DIAGNOSIS — Z23 Encounter for immunization: Secondary | ICD-10-CM | POA: Diagnosis not present

## 2020-02-22 DIAGNOSIS — Z87891 Personal history of nicotine dependence: Secondary | ICD-10-CM | POA: Diagnosis not present

## 2020-02-22 DIAGNOSIS — Y92199 Unspecified place in other specified residential institution as the place of occurrence of the external cause: Secondary | ICD-10-CM | POA: Insufficient documentation

## 2020-02-22 DIAGNOSIS — Y998 Other external cause status: Secondary | ICD-10-CM | POA: Insufficient documentation

## 2020-02-22 DIAGNOSIS — Y9389 Activity, other specified: Secondary | ICD-10-CM | POA: Insufficient documentation

## 2020-02-22 DIAGNOSIS — S0181XA Laceration without foreign body of other part of head, initial encounter: Secondary | ICD-10-CM | POA: Diagnosis not present

## 2020-02-22 DIAGNOSIS — S0990XA Unspecified injury of head, initial encounter: Secondary | ICD-10-CM | POA: Diagnosis present

## 2020-02-22 HISTORY — DX: Unspecified intracranial injury with loss of consciousness of unspecified duration, initial encounter: S06.9X9A

## 2020-02-22 HISTORY — DX: Unspecified intracranial injury with loss of consciousness status unknown, initial encounter: S06.9XAA

## 2020-02-22 HISTORY — DX: Unspecified dementia, unspecified severity, without behavioral disturbance, psychotic disturbance, mood disturbance, and anxiety: F03.90

## 2020-02-22 MED ORDER — TETANUS-DIPHTH-ACELL PERTUSSIS 5-2.5-18.5 LF-MCG/0.5 IM SUSP
0.5000 mL | Freq: Once | INTRAMUSCULAR | Status: AC
  Administered 2020-02-22: 0.5 mL via INTRAMUSCULAR
  Filled 2020-02-22: qty 0.5

## 2020-02-22 MED ORDER — LIDOCAINE HCL (PF) 1 % IJ SOLN
5.0000 mL | Freq: Once | INTRAMUSCULAR | Status: DC
  Filled 2020-02-22: qty 5
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
## 2020-02-22 NOTE — ED Notes (Signed)
[facility] called for transport to [facility] via [employer]

## 2020-02-22 NOTE — ED Notes (Signed)
Pt guardian/cousin updated.
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
## 2020-02-22 NOTE — Discharge Instructions (Addendum)
Your exam and CT scan are normal at this time. You do not have any signs of a serious head injury. Keep the facial wound clean, dry, and covered. The wound has be repaired with dissolvable sutures and wound glue. Do not apply any ointment, lotions, oils, or creams over the wound glue. Apply ice packs for swelling. Expect a black eye to develop.
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
## 2020-02-22 NOTE — ED Notes (Signed)
Attempt to call [place] unsuccessful. Pt guardian is aware of plan and d/c

## 2020-03-02 ENCOUNTER — Emergency Department
Admission: EM | Admit: 2020-03-02 | Discharge: 2020-03-03 | Disposition: A | Discharge status: Home / Self Care | Payer: Medicare (Managed Care) | Attending: Emergency Medicine | Admitting: Emergency Medicine

## 2020-03-02 ENCOUNTER — Other Ambulatory Visit: Payer: Self-pay

## 2020-03-02 DIAGNOSIS — F0391 Unspecified dementia with behavioral disturbance: Secondary | ICD-10-CM

## 2020-03-02 DIAGNOSIS — Z87820 Personal history of traumatic brain injury: Secondary | ICD-10-CM | POA: Diagnosis not present

## 2020-03-02 DIAGNOSIS — Z87891 Personal history of nicotine dependence: Secondary | ICD-10-CM | POA: Insufficient documentation

## 2020-03-02 DIAGNOSIS — D509 Iron deficiency anemia, unspecified: Secondary | ICD-10-CM | POA: Diagnosis present

## 2020-03-02 DIAGNOSIS — D693 Immune thrombocytopenic purpura: Secondary | ICD-10-CM | POA: Diagnosis not present

## 2020-03-02 DIAGNOSIS — F259 Schizoaffective disorder, unspecified: Secondary | ICD-10-CM | POA: Diagnosis not present

## 2020-03-02 DIAGNOSIS — Z046 Encounter for general psychiatric examination, requested by authority: Secondary | ICD-10-CM | POA: Diagnosis present

## 2020-03-02 DIAGNOSIS — Z79899 Other long term (current) drug therapy: Secondary | ICD-10-CM | POA: Insufficient documentation

## 2020-03-02 DIAGNOSIS — F209 Schizophrenia, unspecified: Secondary | ICD-10-CM | POA: Diagnosis present

## 2020-03-02 LAB — COMPREHENSIVE METABOLIC PANEL
ALT: 10 U/L (ref 0–44)
AST: 16 U/L (ref 15–41)
Albumin: 4.4 g/dL (ref 3.5–5.0)
Alkaline Phosphatase: 88 U/L (ref 38–126)
Anion gap: 7 (ref 5–15)
BUN: 16 mg/dL (ref 8–23)
CO2: 31 mmol/L (ref 22–32)
Calcium: 9.2 mg/dL (ref 8.9–10.3)
Chloride: 103 mmol/L (ref 98–111)
Creatinine, Ser: 1.03 mg/dL (ref 0.61–1.24)
GFR calc Af Amer: >60 mL/min (ref >60)
GFR calc non Af Amer: >60 mL/min (ref >60)
Glucose, Bld: 116 mg/dL — ABNORMAL HIGH (ref 70–99)
Potassium: 4.3 mmol/L (ref 3.5–5.1)
Sodium: 141 mmol/L (ref 135–145)
Total Bilirubin: 0.8 mg/dL (ref 0.3–1.2)
Total Protein: 7.5 g/dL (ref 6.5–8.1)

## 2020-03-02 LAB — CBC
HCT: 44.7 % (ref 39.0–52.0)
Hemoglobin: 14.8 g/dL (ref 13.0–17.0)
MCH: 32.5 pg (ref 26.0–34.0)
MCHC: 33.1 g/dL (ref 30.0–36.0)
MCV: 98 fL (ref 80.0–100.0)
Platelets: 91 10*3/uL — ABNORMAL LOW (ref 150–400)
RBC: 4.56 MIL/uL (ref 4.22–5.81)
RDW: 11.9 % (ref 11.5–15.5)
WBC: 5.6 10*3/uL (ref 4.0–10.5)
nRBC: 0 % (ref 0.0–0.2)

## 2020-03-02 LAB — ETHANOL: Alcohol, Ethyl (B): <10 mg/dL (ref <10)

## 2020-03-02 LAB — ACETAMINOPHEN LEVEL: Acetaminophen (Tylenol), Serum: <10 ug/mL — ABNORMAL LOW (ref 10–30)

## 2020-03-02 LAB — SALICYLATE LEVEL: Salicylate Lvl: <7 mg/dL — ABNORMAL LOW (ref 7.0–30.0)

## 2020-03-02 MED ORDER — ZIPRASIDONE MESYLATE 20 MG IM SOLR
20.0000 mg | Freq: Once | INTRAMUSCULAR | Status: AC
  Administered 2020-03-02: 20 mg via INTRAMUSCULAR

## 2020-03-02 NOTE — ED Notes (Signed)
Pt asked to provide urine sample, states he would provide sample. Pt then tries to walk out to hallway trash can. Redirected to toilet then does not provide sample due to stating he does not need to. Pt assisted to bed at this time and placed under blankets due to being cold

## 2020-03-02 NOTE — ED Notes (Signed)
Pt continuously states, "just leave me the fuck alone" when staff is in room for med admin. Pt hesitant to take injection but does. Officer at door to help direct pt to stay in room at this time. Pt unhappy and states to leave him alone.

## 2020-03-02 NOTE — ED Provider Notes (Signed)
[facility] Emergency Department Provider Note  Time seen: 11:15 PM  I have reviewed the triage vital signs and the nursing notes.   HISTORY  Chief Complaint Psychiatric Evaluation   HPI Julian [name] is a 64 y.o. male with a past medical history of dementia, schizophrenia, presents to the emergency department for significant agitation/aggression.  According to report patient was extremely agitated/aggressive at his nursing facility requiring police involvement.  Patient was brought under IVC by police to the emergency department for evaluation.  Here the patient has dementia cannot contribute to his history.  States "nothing is wrong with me."  Patient is eating, is in bed is calm but does get agitated when you speak to him.   Past Medical History:  Diagnosis Date  . Chronic ITP (idiopathic thrombocytopenia) (HCC)   . Dementia (Julian [name])   . Iron deficiency anemia   . TBI (traumatic brain injury) Julian [name])     Patient Active Problem List   Diagnosis Date Noted  . Iron deficiency anemia 05/28/2016  . Idiopathic thrombocytopenia purpura (Julian [name]) 04/10/2016    Past Surgical History:  Procedure Laterality Date  . CHOLECYSTECTOMY      Prior to Admission medications   Medication Sig Start Date End Date Taking? Authorizing Provider  donepezil (ARICEPT) 10 MG tablet Take 10 mg by mouth at bedtime. 05/16/15   [provider]  ferrous sulfate 325 (65 FE) MG tablet Take 325 mg by mouth daily with breakfast.    [provider]  gabapentin (NEURONTIN) 100 MG capsule Take 100 mg by mouth 2 (two) times daily. 02/14/16   [provider]  memantine (NAMENDA) 10 MG tablet Take by mouth. 06/06/16   [provider]  omeprazole (PRILOSEC) 20 MG capsule Take 1 capsule (20 mg total) by mouth daily. 09/27/16   Lequita Asal, MD  predniSONE (DELTASONE) 5 MG tablet Take 5 mg (1 pill) alternating with 10 mg (2 pills) every other day. Patient not  taking: Reported on 02/05/2017 10/08/16   Lequita Asal, MD    No Known Allergies  Family History  Problem Relation Age of Onset  . Hypertension Father   . Diabetes Father   . Lung cancer Brother   . Hypertension Maternal Grandmother   . Ovarian cancer Maternal Grandmother   . Diabetes Maternal Grandmother   . Prostate cancer Brother     Social History Social History   Tobacco Use  . Smoking status: Former Research scientist (life sciences)  . Smokeless tobacco: Never Used  Vaping Use  . Vaping Use: Never used  Substance Use Topics  . Alcohol use: No  . Drug use: Never    Review of Systems Unable to obtain adequate/accurate review of systems secondary to baseline dementia.  ____________________________________________   PHYSICAL EXAM:  VITAL SIGNS: ED Triage Vitals [03/02/20 1728]  Enc Vitals Group     BP (!) 153/93     Pulse Rate 81     Resp 18     Temp (!) 97.4 F (36.3 C)     Temp Source Oral     SpO2 97 %     Weight 110 lb (49.9 kg)     Height 5\' 6"  (1.676 m)     Head Circumference      Peak Flow      Pain Score 0     Pain Loc      Pain Edu?      Excl. in Julian [name]?    Constitutional: Patient is awake alert,  lying in bed, no acute distress.  Unable to answer orientation questions correctly. Eyes: Normal exam ENT      Head: Normocephalic and atraumatic.      Mouth/Throat: Mucous membranes are moist. Cardiovascular: Normal rate, regular rhythm. Respiratory: Normal respiratory effort without tachypnea nor retractions. Breath sounds are clear  Gastrointestinal: Soft and nontender. No distention.   Musculoskeletal: Nontender with normal range of motion in all extremities.  Neurologic:  Normal speech and language. No gross focal neurologic deficits  Skin:  Skin is warm, dry and intact.  Psychiatric: Mood and affect are normal.   ____________________________________________   INITIAL IMPRESSION / ASSESSMENT AND PLAN / ED COURSE  Pertinent labs & imaging results that were  available during my care of the patient were reviewed by me and considered in my medical decision making (see chart for details).   Patient presents to the emergency department under IVC brought by police for agitation as nursing facility.  Currently patient is calm cooperative does have dementia cannot contribute to his history or review of systems.  Patient denies any medical complaints at this time.  Overall well-appearing physical exam.  We will maintain the IVC until patient can be adequately evaluated by psychiatry.  Lab work is largely unrevealing.  Julian [name] was evaluated in Emergency Department on 03/02/2020 for the symptoms described in the history of present illness. He was evaluated in the context of the global COVID-19 pandemic, which necessitated consideration that the patient might be at risk for infection with the SARS-CoV-2 virus that causes COVID-19. Institutional protocols and algorithms that pertain to the evaluation of patients at risk for COVID-19 are in a state of rapid change based on information released by regulatory bodies including the CDC and federal and state organizations. These policies and algorithms were followed during the patient's care in the ED.  The patient has been placed in psychiatric observation due to the need to provide a safe environment for the patient while obtaining psychiatric consultation and evaluation, as well as ongoing medical and medication management to treat the patient's condition.  The patient has been placed under full IVC at this time.   ____________________________________________   FINAL CLINICAL IMPRESSION(S) / ED DIAGNOSES  Dementia   Harvest Dark, MD 03/02/20 2320

## 2020-03-02 NOTE — ED Triage Notes (Signed)
Pt here via [employer] from [facility]  for IVC. Per police reports pt was highly agitated, aggressive and delusional. Per report pt believes that staff and officers were "on his land and they need to get off of it". PT is poor historian and only answers yes or no to questions.

## 2020-03-02 NOTE — ED Notes (Signed)
1 gray tshirt 1 pair black slip on shoes 1 pair black socks 1 depends 1 pair light burgundy sweat pants  Pt belongings placed in labeled belongings bag. Pt dressed in burgundy scrubs'

## 2020-03-02 NOTE — ED Notes (Signed)
Dr. Durenda Guthrie [facility]) called if you need any thing answered call her [phone]

## 2020-03-02 NOTE — ED Notes (Signed)
Pt is continuously entering  into hallway and is unhappy that he is here. Pt states to this nurse to "get the fuck out my face" among other statements. Pt is not physical, only uncooperative and aggressive. Pt redirected back to room by this nurse, Yetta Flock, Tech, and Officer. MD notified, orders to follow

## 2020-03-03 DIAGNOSIS — F209 Schizophrenia, unspecified: Secondary | ICD-10-CM | POA: Diagnosis present

## 2020-03-03 MED ORDER — FERROUS SULFATE 325 (65 FE) MG PO TABS
325.0000 mg | ORAL_TABLET | Freq: Once | ORAL | Status: AC
  Administered 2020-03-03: 325 mg via ORAL
  Filled 2020-03-03: qty 1

## 2020-03-03 MED ORDER — QUETIAPINE FUMARATE 25 MG PO TABS
50.0000 mg | ORAL_TABLET | Freq: Once | ORAL | Status: AC
  Administered 2020-03-03: 50 mg via ORAL
  Filled 2020-03-03: qty 2

## 2020-03-03 MED ORDER — GABAPENTIN 100 MG PO CAPS
100.0000 mg | ORAL_CAPSULE | Freq: Once | ORAL | Status: AC
  Administered 2020-03-03: 100 mg via ORAL
  Filled 2020-03-03: qty 1

## 2020-03-03 MED ORDER — PREDNISONE 10 MG PO TABS
5.0000 mg | ORAL_TABLET | Freq: Once | ORAL | Status: AC
  Administered 2020-03-03: 5 mg via ORAL
  Filled 2020-03-03: qty 1

## 2020-03-03 MED ORDER — PANTOPRAZOLE SODIUM 40 MG PO TBEC
40.0000 mg | DELAYED_RELEASE_TABLET | Freq: Every day | ORAL | Status: DC
  Administered 2020-03-03: 40 mg via ORAL
  Filled 2020-03-03: qty 1

## 2020-03-03 MED ORDER — MEMANTINE HCL 5 MG PO TABS
10.0000 mg | ORAL_TABLET | Freq: Once | ORAL | Status: AC
  Administered 2020-03-03: 10 mg via ORAL
  Filled 2020-03-03: qty 2

## 2020-03-03 NOTE — ED Notes (Addendum)
Attempt to use urinal unsuccessful. Pt states after having urinal ready to collect urine states "I don't have to go as bad as I had to." Pt repeats this statement 4 more times without saying anything else. Assisted by this nurse and Annie Main, RN. Pt previously had male purewick on and had pulled that off and refused to keep it on person.

## 2020-03-03 NOTE — ED Notes (Signed)
Pt walks into hallway. Pt had urinated in bed and soaked through brief, pants, shirt, chuck, and sheet. Pt and bed were changed. Pt back to bed at this time. Pt unable to understand some commands. Takes many of times being asked to understand what is being said

## 2020-03-03 NOTE — Consult Note (Signed)
[facility] Psychiatry Consult   Reason for Consult:Psychiatric Evaluation Referring Physician: Dr. Kerman Passey Patient Identification: Julian [name] MRN:  [id number] Principal Diagnosis: <principal problem not specified> Diagnosis:  Active Problems:   Idiopathic thrombocytopenia purpura ([place])   Iron deficiency anemia   Schizophrenia ([place])   Total Time spent with patient: 20 minutes  Subjective:   Julian [name] is a 64 y.o. male patient presented to [facility] ED via law enforcement under involuntary commitment status (IVC) from [employer].  Per the triage nurse notes, law enforcement reports the patient was highly agitated, aggressive, and delusional. Per the report, the patient believes that staff and officers were "on his land, and they need to get off of it." The patient is a poor historian and can only say his first and last name.  The patient also has dementia and is unable to answer any questions that this posed to him. The patient was seen face-to-face by this provider; the chart was reviewed and consulted with Dr. Kerman Passey on 03/03/2020 due to the patient's care. It was discussed with the EDP that the patient does meet the criteria to be admitted to the geriatrics psychiatric inpatient unit. On evaluation, the patient is alert and oriented x 1, agitated, confused with attempts to be cooperative, and mood-congruent with affect. The patient is unable to be assessed due to him being a poor historian. Plan: The patient is a safety risk to self and requires geriatrics psychiatric inpatient admission for stabilization and treatment.  Plan: The patient is a safety risk to self and requires geriatrics psychiatric inpatient admission for stabilization and treatment.  HPI: Per Dr. Milus Height: Julian [name] is a 64 y.o. male with a past medical history of dementia, schizophrenia, presents to the emergency department for significant agitation/aggression.  According to  report patient was extremely agitated/aggressive at his nursing facility requiring police involvement.  Patient was brought under IVC by police to the emergency department for evaluation.  Here the patient has dementia cannot contribute to his history.  States "nothing is wrong with me."  Patient is eating, is in bed is calm but does get agitated when you speak to him.  Past Psychiatric History:  Dementia ([place])  Risk to Self: Suicidal Ideation:  (Unknown) Suicidal Intent:  (Unknown) Is patient at risk for suicide?:  (Unknown) What has been your use of drugs/alcohol within the last 12 months?:  (Unknown) Intentional Self Injurious Behavior: None Risk to Others: Homicidal Ideation:  (Unknown) Thoughts of Harm to Others:  (Unknown) Current Homicidal Intent:  (Unknown) Current Homicidal Plan:  (Unknown) Access to Homicidal Means:  (Unknown) History of harm to others?: No Assessment of Violence: None Noted Violent Behavior Description: None Does patient have access to weapons?:  (Unknown) Criminal Charges Pending?: No Does patient have a court date: No Prior Inpatient Therapy: Prior Inpatient Therapy:  (Unknown) Prior Outpatient Therapy: Prior Outpatient Therapy:  (Unknown)  Past Medical History:  Past Medical History:  Diagnosis Date  . Chronic ITP (idiopathic thrombocytopenia) (HCC)   . Dementia ([place])   . Iron deficiency anemia   . TBI (traumatic brain injury) [facility])     Past Surgical History:  Procedure Laterality Date  . CHOLECYSTECTOMY     Family History:  Family History  Problem Relation Age of Onset  . Hypertension Father   . Diabetes Father   . Lung cancer Brother   . Hypertension Maternal Grandmother   . Ovarian cancer Maternal Grandmother   . Diabetes Maternal Grandmother   .  Prostate cancer Brother    Family Psychiatric  History:  Social History:  Social History   Substance and Sexual Activity  Alcohol Use No     Social History   Substance and Sexual  Activity  Drug Use Never    Social History   Socioeconomic History  . Marital status: Divorced    Spouse name: Not on file  . Number of children: Not on file  . Years of education: Not on file  . Highest education level: Not on file  Occupational History  . Not on file  Tobacco Use  . Smoking status: Former Research scientist (life sciences)  . Smokeless tobacco: Never Used  Vaping Use  . Vaping Use: Never used  Substance and Sexual Activity  . Alcohol use: No  . Drug use: Never  . Sexual activity: Not on file  Other Topics Concern  . Not on file  Social History Narrative  . Not on file   Social Determinants of Health   Financial Resource Strain:   . Difficulty of Paying Living Expenses:   Food Insecurity:   . Worried About Charity fundraiser in the Last Year:   . Arboriculturist in the Last Year:   Transportation Needs:   . Film/video editor (Medical):   Marland Kitchen Lack of Transportation (Non-Medical):   Physical Activity:   . Days of Exercise per Week:   . Minutes of Exercise per Session:   Stress:   . Feeling of Stress :   Social Connections:   . Frequency of Communication with Friends and Family:   . Frequency of Social Gatherings with Friends and Family:   . Attends Religious Services:   . Active Member of Clubs or Organizations:   . Attends Archivist Meetings:   Marland Kitchen Marital Status:    Additional Social History:    Allergies:  No Known Allergies  Labs:  Results for orders placed or performed during the hospital encounter of 03/02/20 (from the past 48 hour(s))  Comprehensive metabolic panel     Status: Abnormal   Collection Time: 03/02/20  5:38 PM  Result Value Ref Range   Sodium 141 135 - 145 mmol/L   Potassium 4.3 3.5 - 5.1 mmol/L   Chloride 103 98 - 111 mmol/L   CO2 31 22 - 32 mmol/L   Glucose, Bld 116 (H) 70 - 99 mg/dL    Comment: Glucose reference range applies only to samples taken after fasting for at least 8 hours.   BUN 16 8 - 23 mg/dL   Creatinine, Ser 1.03  0.61 - 1.24 mg/dL   Calcium 9.2 8.9 - 10.3 mg/dL   Total Protein 7.5 6.5 - 8.1 g/dL   Albumin 4.4 3.5 - 5.0 g/dL   AST 16 15 - 41 U/L   ALT 10 0 - 44 U/L   Alkaline Phosphatase 88 38 - 126 U/L   Total Bilirubin 0.8 0.3 - 1.2 mg/dL   GFR calc non Af Amer >60 >60 mL/min   GFR calc Af Amer >60 >60 mL/min   Anion gap 7 5 - 15    Comment: Performed at [facility], [address], [place], [place] [address]  Ethanol     Status: None   Collection Time: 03/02/20  5:38 PM  Result Value Ref Range   Alcohol, Ethyl (B) <10 <10 mg/dL    Comment: (NOTE) Lowest detectable limit for serum alcohol is 10 mg/dL.  For medical purposes only. Performed at [facility],  [place], [place] [address]   Salicylate level     Status: Abnormal   Collection Time: 03/02/20  5:38 PM  Result Value Ref Range   Salicylate Lvl <5.5 (L) 7.0 - 30.0 mg/dL    Comment: Performed at [facility], [address] [place], [place] [address]  Acetaminophen level     Status: Abnormal   Collection Time: 03/02/20  5:38 PM  Result Value Ref Range   Acetaminophen (Tylenol), Serum <10 (L) 10 - 30 ug/mL    Comment: (NOTE) Therapeutic concentrations vary significantly. A range of 10-30 ug/mL  may be an effective concentration for many patients. However, some  are best treated at concentrations outside of this range. Acetaminophen concentrations >150 ug/mL at 4 hours after ingestion  and >50 ug/mL at 12 hours after ingestion are often associated with  toxic reactions.  Performed at [facility], [address] [place], [place] [address]   cbc     Status: Abnormal   Collection Time: 03/02/20  5:38 PM  Result Value Ref Range   WBC 5.6 4.0 - 10.5 K/uL   RBC 4.56 4.22 - 5.81 MIL/uL   Hemoglobin 14.8 13.0 - 17.0 g/dL   HCT 44.7 39 - 52 %   MCV 98.0 80.0 - 100.0 fL   MCH 32.5 26.0 - 34.0 pg   MCHC 33.1 30.0 - 36.0 g/dL   RDW 11.9 11.5 - 15.5 %   Platelets 91 (L) 150  - 400 K/uL    Comment: Immature Platelet Fraction may be clinically indicated, consider ordering this additional test [id number]    nRBC 0.0 0.0 - 0.2 %    Comment: Performed at [facility], [address] [place], [place] [address]    No current facility-administered medications for this encounter.   Current Outpatient Medications  Medication Sig Dispense Refill  . donepezil (ARICEPT) 10 MG tablet Take 10 mg by mouth at bedtime.    . ferrous sulfate 325 (65 FE) MG tablet Take 325 mg by mouth daily with breakfast.    . gabapentin (NEURONTIN) 100 MG capsule Take 100 mg by mouth 2 (two) times daily.  11  . memantine (NAMENDA) 10 MG tablet Take by mouth.    Marland Kitchen omeprazole (PRILOSEC) 20 MG capsule Take 1 capsule (20 mg total) by mouth daily. 30 capsule 1  . predniSONE (DELTASONE) 5 MG tablet Take 5 mg (1 pill) alternating with 10 mg (2 pills) every other day. (Patient not taking: Reported on 02/05/2017) 30 tablet 1    Musculoskeletal: Strength & Muscle Tone: within normal limits Gait & Station: unsteady Patient leans: N/A  Psychiatric Specialty Exam: Physical Exam  Psychiatric: His affect is inappropriate. He is agitated. Thought content is paranoid and delusional. Cognition and memory are impaired. He expresses inappropriate judgment. He exhibits abnormal recent memory and abnormal remote memory. He is inattentive.    Review of Systems  Psychiatric/Behavioral: Positive for behavioral problems, confusion, decreased concentration and sleep disturbance.  All other systems reviewed and are negative.   Blood pressure (!) 153/93, pulse 81, temperature (!) 97.4 F (36.3 C), temperature source Oral, resp. rate 18, height 5\' 6"  (1.676 m), weight 49.9 kg, SpO2 97 %.Body mass index is 17.75 kg/m.  General Appearance: Casual  Eye Contact:  Good  Speech:  Blocked  Volume:  Decreased  Mood:  Irritable  Affect:  Non-Congruent  Thought Process:  Disorganized and Descriptions of  Associations: Tangential  Orientation:  Other:  x1  Thought  Content:  Illogical, Delusions, Hallucinations: Auditory and Paranoid Ideation  Suicidal Thoughts:  unable to assess.  Homicidal Thoughts:  unable to assess.  Memory:  Immediate;   Poor  Judgement:  Poor  Insight:  unable to assess.  Psychomotor Activity:  Increased  Concentration:  Concentration: Poor and Attention Span: Poor  Recall:  Poor  Fund of Knowledge:  Poor  Language:  Poor  Akathisia:  Negative  Handed:  Right  AIMS (if indicated):     Assets:  Physical Health Resilience Social Support  ADL's:  Impaired  Cognition:  Impaired,  Severe  Sleep:    Insomnia     Treatment Plan Summary: Medication management and Plan Patient meets criteria for psychiatric inpatient admission.  Disposition: Recommend psychiatric Inpatient admission when medically cleared. Supportive therapy provided about ongoing stressors.  Caroline Sauger, NP 03/03/2020 4:06 AM

## 2020-03-03 NOTE — BH Assessment (Signed)
Assessment Note  Julian [name] is an 64 y.o. male presenting to [facility] ED under IVC. Per triage notePt here via BPD from [facility]  for IVC. Per police reports pt was highly agitated, aggressive and delusional. Per report pt believes that staff and officers were "on his land and they need to get off of it". PT is poor historian and only answers yes or no to questions. During assessment patient was alert but only oriented x1, patient was unaware of where he was, this situation or the time. Per IVC paperwork patient is diagnosed with Schizophrenia and last night was highly agitated and aggressive, he is also delusional, he thinks that the staff and officer's were "on his land and they need to get off." Patient was unable to report if he is SI/HI/AH/VH, when asked a second question patient would continue to report his name.  Per Psyc NP patient is recommended for Geriatric Inpatient Hospitalization, recommendation was communicated to patient's legal guardian Denyce Robert [phone]  Diagnosis: Schizophrenia by history   Past Medical History:  Past Medical History:  Diagnosis Date  . Chronic ITP (idiopathic thrombocytopenia) (HCC)   . Dementia ([place])   . Iron deficiency anemia   . TBI (traumatic brain injury) [facility])     Past Surgical History:  Procedure Laterality Date  . CHOLECYSTECTOMY      Family History:  Family History  Problem Relation Age of Onset  . Hypertension Father   . Diabetes Father   . Lung cancer Brother   . Hypertension Maternal Grandmother   . Ovarian cancer Maternal Grandmother   . Diabetes Maternal Grandmother   . Prostate cancer Brother     Social History:  reports that he has quit smoking. He has never used smokeless tobacco. He reports that he does not drink alcohol and does not use drugs.  Additional Social History:  Alcohol / Drug Use Pain Medications: See MAR Prescriptions: See MAR Over the Counter: See MAR History of alcohol / drug use?: No  history of alcohol / drug abuse  CIWA: CIWA-Ar BP: (!) 153/93 Pulse Rate: 81 COWS:    Allergies: No Known Allergies  Home Medications: (Not in a hospital admission)   OB/GYN Status:  No LMP for male patient.  General Assessment Data Location of Assessment: [facility] ED TTS Assessment: In system Is this a Tele or Face-to-Face Assessment?: Face-to-Face Is this an Initial Assessment or a Re-assessment for this encounter?: Initial Assessment Patient Accompanied by:: N/A Language Other than English: No Living Arrangements: In Assisted Living/Nursing Home (Comment: Name of [facility]) What gender do you identify as?: Male Marital status: Single Living Arrangements: Other (Comment) ([place]) Can pt return to current living arrangement?: Yes Admission Status: Involuntary Petitioner: Other Is patient capable of signing voluntary admission?: No Referral Source: Other Insurance type: [facility]  Medical Screening Exam ([place]) Medical Exam completed: Yes  Crisis Care Plan Living Arrangements: Other (Comment) ([place]) Legal Guardian: Other: Denyce Robert ) Name of Psychiatrist: Unknown Name of Therapist: Unknown  Education Status Is patient currently in school?: No Is the patient employed, unemployed or receiving disability?: Unemployed  Risk to self with the past 6 months Suicidal Ideation:  (Unknown) Has patient been a risk to self within the past 6 months prior to admission? : Other (comment) (Unknown) Suicidal Intent:  (Unknown) Has patient had any suicidal intent within the past 6 months prior to admission? :  (Unknown) Is patient at risk  for suicide?:  (Unknown) What has been your use of drugs/alcohol within the last 12 months?:  (Unknown) Previous Attempts/Gestures:  (Unknown) Intentional Self Injurious Behavior: None Family Suicide History: Unknown Recent stressful life event(s): Other (Comment)  (Unknown) Persecutory voices/beliefs?:  (Unknown) Depression:  (Unknown) Substance abuse history and/or treatment for substance abuse?: No Suicide prevention information given to non-admitted patients: Not applicable  Risk to Others within the past 6 months Homicidal Ideation:  (Unknown) Does patient have any lifetime risk of violence toward others beyond the six months prior to admission? : Unknown Thoughts of Harm to Others:  (Unknown) Current Homicidal Intent:  (Unknown) Current Homicidal Plan:  (Unknown) Access to Homicidal Means:  (Unknown) History of harm to others?: No Assessment of Violence: None Noted Violent Behavior Description: None Does patient have access to weapons?:  (Unknown) Criminal Charges Pending?: No Does patient have a court date: No Is patient on probation?: No  Psychosis Hallucinations:  (Unknown) Delusions: Unspecified  Mental Status Report Appearance/Hygiene: In scrubs Eye Contact: Good Motor Activity: Freedom of movement, Rigidity, Agitation, Shuffling Speech: Incoherent Level of Consciousness: Irritable, Alert Mood: Irritable, Angry Affect: Flat Anxiety Level: Minimal Thought Processes: Thought Blocking Judgement: Partial Orientation: Person Obsessive Compulsive Thoughts/Behaviors: None  Cognitive Functioning Concentration: Poor Memory: Recent Impaired, Remote Impaired Is patient IDD: No Insight: Poor Impulse Control: Poor Appetite:  (UTA) Have you had any weight changes? :  (UTA) Sleep: Unable to Assess Vegetative Symptoms: None  ADLScreening [facility] Assessment Services) Patient's cognitive ability adequate to safely complete daily activities?: Yes Patient able to express need for assistance with ADLs?: Yes Independently performs ADLs?: Yes (appropriate for developmental age)  Prior Inpatient Therapy Prior Inpatient Therapy:  (Unknown)  Prior Outpatient Therapy Prior Outpatient Therapy:  (Unknown)  ADL Screening (condition at  time of admission) Patient's cognitive ability adequate to safely complete daily activities?: Yes Is the patient deaf or have difficulty hearing?: No Does the patient have difficulty seeing, even when wearing glasses/contacts?: No Does the patient have difficulty concentrating, remembering, or making decisions?: No Patient able to express need for assistance with ADLs?: Yes Does the patient have difficulty dressing or bathing?: No Independently performs ADLs?: Yes (appropriate for developmental age) Does the patient have difficulty walking or climbing stairs?: No Weakness of Legs: None Weakness of Arms/Hands: None  Home Assistive Devices/Equipment Home Assistive Devices/Equipment: None  Therapy Consults (therapy consults require a physician order) PT Evaluation Needed: No OT Evalulation Needed: No SLP Evaluation Needed: No Abuse/Neglect Assessment (Assessment to be complete while patient is alone) Abuse/Neglect Assessment Can Be Completed: Unable to assess, patient is non-responsive or altered mental status Values / Beliefs Cultural Requests During Hospitalization: None Spiritual Requests During Hospitalization: None Consults Spiritual Care Consult Needed: No Transition of Care Team Consult Needed: No Advance Directives (For Healthcare) Does Patient Have a Medical Advance Directive?: No          Disposition: Per Psyc NP patient is recommended for Geriatric Inpatient Hospitalization.  Disposition Initial Assessment Completed for this Encounter: Yes  On Site Evaluation by:   Reviewed with Physician:    Leonie Douglas MS Post 03/03/2020 3:48 AM

## 2020-03-03 NOTE — ED Notes (Signed)
Pt calm and cooperative, pt in bed attempting to sleep at this time. Pt had bed alarm under pt and on, alarm constantly going off without pt attempting to get out of bed. Turned off at this time, pt states he is going to go back to sleep

## 2020-03-03 NOTE — ED Notes (Signed)
Pt again attempting to get out of bed and bed alarm sounding. Pt states he needs to urinate. Attempting to allow pt to urinate in urinal at this time. Assisted by Annie Main, RN

## 2020-03-03 NOTE — ED Notes (Signed)
Pt attempting to get out of bed and bed alarm sounding. Writer asked patient if he needed to go to the bathroom and patient did not respond. Patient making grunting sounds, denies pain

## 2020-03-03 NOTE — ED Notes (Signed)
Pt had urinated on self, in toilet and in floor. Pt was assisted in cleaning and changing by this nurse and Annie Main, Therapist, sports. Pt again reminded on need of urine sample but that was not understood by pt at this time.

## 2020-03-03 NOTE — ED Notes (Signed)
IVC  PAPERS  RESCINDED  PER  DR  ISAACS MD  INFORMED  JADEKA  RN

## 2020-03-03 NOTE — ED Notes (Signed)
Patient eating breakfast. °

## 2020-03-03 NOTE — Discharge Instructions (Addendum)
Follow-up with [facility] as directed

## 2020-03-03 NOTE — ED Notes (Signed)
Spoke with Devin from TTS and the psychiatry team will come and assess patient

## 2020-03-03 NOTE — BH Assessment (Addendum)
Referral information for Psychiatric Hospitalization faxed to;   [facility] [phone]), John reports no current beds  . [facility] [phone] -[phone]) [phone] Denied due to aggression  . [facility] [phone]), Reports no current beds available  . [facility] [phone], [phone], [phone] or [phone]),   . [facility] [phone]   . [facility] [phone] or [phone])  . [facility] [phone] or [phone]),   . [facility] [phone]),   . [facility] [phone] -or- [phone]),

## 2020-03-03 NOTE — ED Notes (Signed)
Spoke with Denyce Robert patient legal guardian and informed her that patient will be going with [facility] and then back to [facility]

## 2020-03-14 ENCOUNTER — Encounter: Payer: Self-pay | Admitting: Urology

## 2020-03-14 ENCOUNTER — Ambulatory Visit (INDEPENDENT_AMBULATORY_CARE_PROVIDER_SITE_OTHER): Payer: Medicare (Managed Care) | Admitting: Urology

## 2020-03-14 ENCOUNTER — Other Ambulatory Visit: Payer: Self-pay

## 2020-03-14 VITALS — BP 109/67 | HR 95

## 2020-03-14 DIAGNOSIS — R319 Hematuria, unspecified: Secondary | ICD-10-CM

## 2020-03-14 MED ORDER — CIPROFLOXACIN HCL 250 MG PO TABS: 250.0000 mg | ORAL_TABLET | Freq: Two times a day (BID) | ORAL | 0 refills | Status: DC

## 2020-03-14 NOTE — Progress Notes (Signed)
03/14/2020 9:42 AM   Julian [name] [birth year] [id number]  Referring provider: No referring provider defined for this encounter.  Chief Complaint  Patient presents with  . Hematuria    HPI: Patient recently seen in the emergency room with dementia and schizophrenia with agitation and aggression.  He was placed in psychiatric observation and may have been brought to the hospital by the police from the nursing facility.   History was limited.  Nursing with the patient said he can become quite anxious quite quickly.  The patient said his flow was good.  There is question that he might have hematuria.  Subjectively urine in our office was clear.  Medical records were sent over.  There was a diagnosis of hematuria idiopathic thrombocytopenia dementia aortic aneurysm peripheral neuropathy anemia and traumatic brain injury.  Urine was pink-tinged and he had passed some clots.  He was bladder scanned for 17 mL.  He actually tolerated in and out catheterization easily and the catheter was flushed with 120 mL of normal saline and pink-tinged urine.  Urology was called over the weekend I believe.    PMH: Past Medical History:  Diagnosis Date  . Chronic ITP (idiopathic thrombocytopenia) (HCC)   . Dementia ([place])   . Iron deficiency anemia   . TBI (traumatic brain injury) [facility])     Surgical History: Past Surgical History:  Procedure Laterality Date  . CHOLECYSTECTOMY      Home Medications:  Allergies as of 03/14/2020   No Known Allergies     Medication List       Accurate as of March 14, 2020  9:42 AM. If you have any questions, ask your nurse or doctor.        donepezil 10 MG tablet Commonly known as: ARICEPT Take 10 mg by mouth at bedtime.   ferrous sulfate 325 (65 FE) MG tablet Take 325 mg by mouth daily with breakfast.   gabapentin 100 MG capsule Commonly known as: NEURONTIN Take 100 mg by mouth 2 (two) times daily.   memantine 10 MG tablet Commonly known as:  NAMENDA Take by mouth.   omeprazole 20 MG capsule Commonly known as: PRILOSEC Take 1 capsule (20 mg total) by mouth daily.   predniSONE 5 MG tablet Commonly known as: DELTASONE Take 5 mg (1 pill) alternating with 10 mg (2 pills) every other day.       Allergies: No Known Allergies  Family History: Family History  Problem Relation Age of Onset  . Hypertension Father   . Diabetes Father   . Lung cancer Brother   . Hypertension Maternal Grandmother   . Ovarian cancer Maternal Grandmother   . Diabetes Maternal Grandmother   . Prostate cancer Brother     Social History:  reports that he has quit smoking. He has never used smokeless tobacco. He reports that he does not drink alcohol and does not use drugs.  ROS:                                        Physical Exam: BP 109/67   Pulse 95   Constitutional:  Alert and oriented, No acute distress. HEENT: [place] AT, moist mucus membranes.  Trachea midline, no masses. Cardiovascular: No clubbing, cyanosis, or edema. Respiratory: Normal respiratory effort, no increased work of breathing. GI: Abdomen is soft, nontender, nondistended, no abdominal masses GU: No CVA tenderness.  Male genitalia normal  Skin: No rashes, bruises or suspicious lesions. Lymph: No cervical or inguinal adenopathy. Neurologic: Grossly intact, no focal deficits, moving all 4 extremities. Psychiatric: Normal mood and affect.  Laboratory Data: Lab Results  Component Value Date   WBC 5.6 03/02/2020   HGB 14.8 03/02/2020   HCT 44.7 03/02/2020   MCV 98.0 03/02/2020   PLT 91 (L) 03/02/2020    Lab Results  Component Value Date   CREATININE 1.03 03/02/2020    No results found for: PSA  No results found for: TESTOSTERONE  No results found for: HGBA1C  Urinalysis No results found for: COLORURINE, APPEARANCEUR, LABSPEC, PHURINE, GLUCOSEU, HGBUR, BILIRUBINUR, KETONESUR, PROTEINUR, UROBILINOGEN, NITRITE, LEUKOCYTESUR  Pertinent  Imaging: No x-ray Chart reviewed  Assessment & Plan: The patient will be treated as a bladder infection.  I called in ciprofloxacin 250 mg twice a day for 1 week.  We will try to call over and see if the urine culture was ever done.  I will order CT scan. Depending on the CT scan findings his management may vary based on comorbidities and his dementia.  The notes sent over were very reasonable and we will scan them.  I will bring him back to see one of my partners.  The nurse said he can escalate quite quickly and the question is whether or not he should have cystoscopy under anesthesia versus in the office or whether he could be monitored by watchful waiting based upon comorbidities and his dementia.  Obviously if he is put to sleep we will need to be ready for transurethral resection    There are no diagnoses linked to this encounter.  No follow-ups on file.  Reece Packer, MD  [facility] [address] [place],  [address] [phone]

## 2020-03-14 NOTE — Patient Instructions (Signed)
Cystoscopy Cystoscopy is a procedure that is used to help diagnose and sometimes treat conditions that affect the lower urinary tract. The lower urinary tract includes the bladder and the urethra. The urethra is the tube that drains urine from the bladder. Cystoscopy is done using a thin, tube-shaped instrument with a light and camera at the end (cystoscope). The cystoscope may be hard or flexible, depending on the goal of the procedure. The cystoscope is inserted through the urethra, into the bladder. Cystoscopy may be recommended if you have:  Urinary tract infections that keep coming back.  Blood in the urine (hematuria).  An inability to control when you urinate (urinary incontinence) or an overactive bladder.  Unusual cells found in a urine sample.  A blockage in the urethra, such as a urinary stone.  Painful urination.  An abnormality in the bladder found during an intravenous pyelogram (IVP) or CT scan. Cystoscopy may also be done to remove a sample of tissue to be examined under a microscope (biopsy). What are the risks? Generally, this is a safe procedure. However, problems may occur, including:  Infection.  Bleeding.  What happens during the procedure?  1. You will be given one or more of the following: ? A medicine to numb the area (local anesthetic). 2. The area around the opening of your urethra will be cleaned. 3. The cystoscope will be passed through your urethra into your bladder. 4. Germ-free (sterile) fluid will flow through the cystoscope to fill your bladder. The fluid will stretch your bladder so that your health care provider can clearly examine your bladder walls. 5. Your doctor will look at the urethra and bladder. 6. The cystoscope will be removed The procedure may vary among health care providers  What can I expect after the procedure? After the procedure, it is common to have: 1. Some soreness or pain in your abdomen and urethra. 2. Urinary symptoms.  These include: ? Mild pain or burning when you urinate. Pain should stop within a few minutes after you urinate. This may last for up to 1 week. ? A small amount of blood in your urine for several days. ? Feeling like you need to urinate but producing only a small amount of urine. Follow these instructions at home: General instructions  Return to your normal activities as told by your health care provider.   Do not drive for 24 hours if you were given a sedative during your procedure.  Watch for any blood in your urine. If the amount of blood in your urine increases, call your health care provider.  If a tissue sample was removed for testing (biopsy) during your procedure, it is up to you to get your test results. Ask your health care provider, or the department that is doing the test, when your results will be ready.  Drink enough fluid to keep your urine pale yellow.  Keep all follow-up visits as told by your health care provider. This is important. Contact a health care provider if you:  Have pain that gets worse or does not get better with medicine, especially pain when you urinate.  Have trouble urinating.  Have more blood in your urine. Get help right away if you:  Have blood clots in your urine.  Have abdominal pain.  Have a fever or chills.  Are unable to urinate. Summary  Cystoscopy is a procedure that is used to help diagnose and sometimes treat conditions that affect the lower urinary tract.  Cystoscopy is done using   a thin, tube-shaped instrument with a light and camera at the end.  After the procedure, it is common to have some soreness or pain in your abdomen and urethra.  Watch for any blood in your urine. If the amount of blood in your urine increases, call your health care provider.  If you were prescribed an antibiotic medicine, take it as told by your health care provider. Do not stop taking the antibiotic even if you start to feel better. This  information is not intended to replace advice given to you by your health care provider. Make sure you discuss any questions you have with your health care provider. Document Revised: 08/26/2018 Document Reviewed: 08/26/2018 Elsevier Patient Education  2020 Elsevier Inc.   

## 2020-03-15 LAB — URINALYSIS, COMPLETE
Bilirubin, UA: NEGATIVE
Glucose, UA: NEGATIVE
Ketones, UA: NEGATIVE
Leukocytes,UA: NEGATIVE
Nitrite, UA: NEGATIVE
Protein,UA: NEGATIVE
RBC, UA: NEGATIVE
Specific Gravity, UA: <1.005 — ABNORMAL LOW (ref 1.005–1.030)
Urobilinogen, Ur: 0.2 mg/dL (ref 0.2–1.0)
pH, UA: 6.5 (ref 5.0–7.5)

## 2020-03-15 LAB — MICROSCOPIC EXAMINATION

## 2020-03-20 LAB — CULTURE, URINE COMPREHENSIVE

## 2020-03-30 ENCOUNTER — Ambulatory Visit
Admission: RE | Admit: 2020-03-30 | Discharge: 2020-03-30 | Disposition: A | Discharge status: Home / Self Care | Payer: Medicare (Managed Care) | Source: Ambulatory Visit | Attending: Urology | Admitting: Urology

## 2020-03-30 ENCOUNTER — Other Ambulatory Visit: Payer: Self-pay

## 2020-03-30 DIAGNOSIS — R319 Hematuria, unspecified: Secondary | ICD-10-CM | POA: Diagnosis present

## 2020-03-30 MED ORDER — IOHEXOL 300 MG/ML  SOLN
125.0000 mL | Freq: Once | INTRAMUSCULAR | Status: AC | PRN
  Administered 2020-03-30: 100 mL via INTRAVENOUS

## 2020-03-31 ENCOUNTER — Other Ambulatory Visit: Payer: Self-pay | Admitting: Urology

## 2020-03-31 ENCOUNTER — Ambulatory Visit (INDEPENDENT_AMBULATORY_CARE_PROVIDER_SITE_OTHER): Payer: Medicare (Managed Care) | Admitting: Urology

## 2020-03-31 ENCOUNTER — Encounter: Payer: Self-pay | Admitting: Urology

## 2020-03-31 VITALS — BP 128/84 | HR 70 | Ht 70.0 in | Wt 175.0 lb

## 2020-03-31 DIAGNOSIS — R319 Hematuria, unspecified: Secondary | ICD-10-CM

## 2020-03-31 NOTE — Progress Notes (Signed)
° °  03/31/2020 9:48 AM   Julian [name] 08-12-1956 [id number]  Reason for visit: Follow up hematuria/UTI  HPI: I saw Julian [name] and his guardian in urology clinic today.  Briefly, he is a 64 year old male with history of dementia and schizophrenia who recently had an episode of gross hematuria and clots and was evaluated by Dr. Vikki Ports in clinic on 03/14/2020.  Urinalysis at that time did show UTI with Klebsiella, and he was treated with culture appropriate Cipro.  A CT urogram was performed because of the significance of the hematuria, and this showed only a horseshoe kidney but no stones, hydronephrosis, filling defects, or masses.  I reviewed the film with his caregiver today and the patient.  He has continued to have some dark urine, but his caregiver is unsure if there is any blood or blood clots.  His urinalysis today is completely benign with 0-5 WBCs, 0-2 RBCs, no bacteria, nitrite negative, no leukocytes.  We discussed options at length including cystoscopy to complete gross hematuria work-up, versus observation in the setting of his hematuria at the time of a known UTI.  In the setting of his normal CT scan, I would strongly avoid cystoscopy in this patient who has a history of severe agitation and aggressiveness with procedures, as well as his severe dementia and poor understanding of his medical problems.  We opted to send a urine cytology today, and if this was suspicious or positive could consider cystoscopy and bladder biopsy in the operating room.  Follow-up urine culture, urine cytology, call with results  I spent 20 total minutes on the day of the encounter including pre-visit review of the medical record, face-to-face time with the patient, and post visit ordering of labs/imaging/tests.  Billey Co, [facility] [address], [place], [place] [address] [phone]

## 2020-04-01 LAB — URINALYSIS, COMPLETE
Bilirubin, UA: NEGATIVE
Glucose, UA: NEGATIVE
Ketones, UA: NEGATIVE
Leukocytes,UA: NEGATIVE
Nitrite, UA: NEGATIVE
Protein,UA: NEGATIVE
RBC, UA: NEGATIVE
Specific Gravity, UA: 1.02 (ref 1.005–1.030)
Urobilinogen, Ur: 2 mg/dL — ABNORMAL HIGH (ref 0.2–1.0)
pH, UA: 6 (ref 5.0–7.5)

## 2020-04-01 LAB — MICROSCOPIC EXAMINATION: Bacteria, UA: NONE SEEN

## 2020-04-01 LAB — CYTOLOGY - NON PAP

## 2020-04-04 ENCOUNTER — Telehealth: Payer: Self-pay

## 2020-04-04 LAB — PATHOLOGY

## 2020-04-04 LAB — CULTURE, URINE COMPREHENSIVE

## 2020-04-04 NOTE — Telephone Encounter (Signed)
Called pt's caregiver informed her of the results below. She gave verbal understanding.

## 2020-04-04 NOTE — Telephone Encounter (Signed)
-----   Message from Billey Co, MD sent at 04/02/2020 11:24 AM EDT ----- Good news, no cancer cells seen on urine sample, follow-up as needed with urology  Nickolas Madrid, MD 04/02/2020

## 2021-07-20 ENCOUNTER — Ambulatory Visit: Payer: Self-pay | Admitting: Podiatry

## 2022-03-12 ENCOUNTER — Emergency Department: Payer: Medicare (Managed Care)

## 2022-03-12 ENCOUNTER — Encounter: Payer: Self-pay | Admitting: Emergency Medicine

## 2022-03-12 ENCOUNTER — Other Ambulatory Visit: Payer: Self-pay

## 2022-03-12 ENCOUNTER — Emergency Department
Admission: EM | Admit: 2022-03-12 | Discharge: 2022-03-12 | Disposition: A | Discharge status: Home / Self Care | Payer: Medicare (Managed Care) | Attending: Emergency Medicine | Admitting: Emergency Medicine

## 2022-03-12 DIAGNOSIS — Z87891 Personal history of nicotine dependence: Secondary | ICD-10-CM | POA: Insufficient documentation

## 2022-03-12 DIAGNOSIS — I700 Atherosclerosis of aorta: Secondary | ICD-10-CM | POA: Diagnosis not present

## 2022-03-12 DIAGNOSIS — J439 Emphysema, unspecified: Secondary | ICD-10-CM | POA: Insufficient documentation

## 2022-03-12 DIAGNOSIS — F0390 Unspecified dementia without behavioral disturbance: Secondary | ICD-10-CM | POA: Insufficient documentation

## 2022-03-12 DIAGNOSIS — R4182 Altered mental status, unspecified: Secondary | ICD-10-CM

## 2022-03-12 DIAGNOSIS — G9340 Encephalopathy, unspecified: Secondary | ICD-10-CM | POA: Insufficient documentation

## 2022-03-12 DIAGNOSIS — Z7952 Long term (current) use of systemic steroids: Secondary | ICD-10-CM | POA: Diagnosis not present

## 2022-03-12 DIAGNOSIS — R7402 Elevation of levels of lactic acid dehydrogenase (LDH): Secondary | ICD-10-CM | POA: Diagnosis not present

## 2022-03-12 LAB — CBC WITH DIFFERENTIAL/PLATELET
Abs Immature Granulocytes: 0.04 10*3/uL (ref 0.00–0.07)
Basophils Absolute: 0.1 10*3/uL (ref 0.0–0.1)
Basophils Relative: 1 %
Eosinophils Absolute: 0.1 10*3/uL (ref 0.0–0.5)
Eosinophils Relative: 1 %
HCT: 42.5 % (ref 39.0–52.0)
Hemoglobin: 13.9 g/dL (ref 13.0–17.0)
Immature Granulocytes: 1 %
Lymphocytes Relative: 49 %
Lymphs Abs: 2.5 10*3/uL (ref 0.7–4.0)
MCH: 31.4 pg (ref 26.0–34.0)
MCHC: 32.7 g/dL (ref 30.0–36.0)
MCV: 96.2 fL (ref 80.0–100.0)
Monocytes Absolute: 0.5 10*3/uL (ref 0.1–1.0)
Monocytes Relative: 11 %
Neutro Abs: 1.9 10*3/uL (ref 1.7–7.7)
Neutrophils Relative %: 37 %
Platelets: 79 10*3/uL — ABNORMAL LOW (ref 150–400)
RBC: 4.42 MIL/uL (ref 4.22–5.81)
RDW: 13 % (ref 11.5–15.5)
WBC: 5.1 10*3/uL (ref 4.0–10.5)
nRBC: 0 % (ref 0.0–0.2)

## 2022-03-12 LAB — COMPREHENSIVE METABOLIC PANEL
ALT: 9 U/L (ref 0–44)
AST: 20 U/L (ref 15–41)
Albumin: 3.6 g/dL (ref 3.5–5.0)
Alkaline Phosphatase: 63 U/L (ref 38–126)
Anion gap: 11 (ref 5–15)
BUN: 16 mg/dL (ref 8–23)
CO2: 24 mmol/L (ref 22–32)
Calcium: 9.2 mg/dL (ref 8.9–10.3)
Chloride: 107 mmol/L (ref 98–111)
Creatinine, Ser: 0.86 mg/dL (ref 0.61–1.24)
GFR, Estimated: >60 mL/min (ref >60)
Glucose, Bld: 100 mg/dL — ABNORMAL HIGH (ref 70–99)
Potassium: 3.7 mmol/L (ref 3.5–5.1)
Sodium: 142 mmol/L (ref 135–145)
Total Bilirubin: 0.8 mg/dL (ref 0.3–1.2)
Total Protein: 6.7 g/dL (ref 6.5–8.1)

## 2022-03-12 LAB — BLOOD GAS, VENOUS
Acid-Base Excess: 2.5 mmol/L — ABNORMAL HIGH (ref 0.0–2.0)
Bicarbonate: 28.4 mmol/L — ABNORMAL HIGH (ref 20.0–28.0)
O2 Saturation: 87.1 %
Patient temperature: 37
pCO2, Ven: 48 mmHg (ref 44–60)
pH, Ven: 7.38 (ref 7.25–7.43)
pO2, Ven: 59 mmHg — ABNORMAL HIGH (ref 32–45)

## 2022-03-12 LAB — URINALYSIS, ROUTINE W REFLEX MICROSCOPIC
Bilirubin Urine: NEGATIVE
Glucose, UA: NEGATIVE mg/dL
Hgb urine dipstick: NEGATIVE
Ketones, ur: NEGATIVE mg/dL
Leukocytes,Ua: NEGATIVE
Nitrite: NEGATIVE
Protein, ur: NEGATIVE mg/dL
Specific Gravity, Urine: 1.027 (ref 1.005–1.030)
pH: 9 — ABNORMAL HIGH (ref 5.0–8.0)

## 2022-03-12 LAB — URINE DRUG SCREEN, QUALITATIVE (ARMC ONLY)
Amphetamines, Ur Screen: NOT DETECTED
Barbiturates, Ur Screen: NOT DETECTED
Benzodiazepine, Ur Scrn: NOT DETECTED
Cannabinoid 50 Ng, Ur ~~LOC~~: NOT DETECTED
Cocaine Metabolite,Ur ~~LOC~~: NOT DETECTED
MDMA (Ecstasy)Ur Screen: NOT DETECTED
Methadone Scn, Ur: NOT DETECTED
Opiate, Ur Screen: NOT DETECTED
Phencyclidine (PCP) Ur S: NOT DETECTED
Tricyclic, Ur Screen: NOT DETECTED

## 2022-03-12 LAB — LACTIC ACID, PLASMA
Lactic Acid, Venous: 2.7 mmol/L (ref 0.5–1.9)
Lactic Acid, Venous: 2.2 mmol/L (ref 0.5–1.9)

## 2022-03-12 LAB — CBC
HCT: 43.4 % (ref 39.0–52.0)
Hemoglobin: 14 g/dL (ref 13.0–17.0)
MCH: 31.1 pg (ref 26.0–34.0)
MCHC: 32.3 g/dL (ref 30.0–36.0)
MCV: 96.4 fL (ref 80.0–100.0)
Platelets: 81 10*3/uL — ABNORMAL LOW (ref 150–400)
RBC: 4.5 MIL/uL (ref 4.22–5.81)
RDW: 13 % (ref 11.5–15.5)
WBC: 5.4 10*3/uL (ref 4.0–10.5)
nRBC: 0 % (ref 0.0–0.2)

## 2022-03-12 LAB — TROPONIN I (HIGH SENSITIVITY)
Troponin I (High Sensitivity): 3 ng/L (ref <18)
Troponin I (High Sensitivity): 8 ng/L (ref <18)

## 2022-03-12 LAB — PROCALCITONIN: Procalcitonin: <0.1 ng/mL

## 2022-03-12 LAB — CBG MONITORING, ED: Glucose-Capillary: 93 mg/dL (ref 70–99)

## 2022-03-12 MED ORDER — IOHEXOL 350 MG/ML SOLN
60.0000 mL | Freq: Once | INTRAVENOUS | Status: AC | PRN
  Administered 2022-03-12: 60 mL via INTRAVENOUS

## 2022-03-12 MED ORDER — GABAPENTIN 100 MG PO CAPS
100.0000 mg | ORAL_CAPSULE | Freq: Two times a day (BID) | ORAL | Status: DC
Start: 2022-03-12 — End: 2022-03-12

## 2022-03-12 MED ORDER — FERROUS SULFATE 325 (65 FE) MG PO TABS: 325.0000 mg | ORAL_TABLET | Freq: Every day | ORAL | Status: DC

## 2022-03-12 MED ORDER — SODIUM CHLORIDE 0.9 % IV SOLN: INTRAVENOUS | Status: DC

## 2022-03-12 MED ORDER — PANTOPRAZOLE SODIUM 40 MG PO TBEC: 40.0000 mg | DELAYED_RELEASE_TABLET | Freq: Every day | ORAL | Status: DC

## 2022-03-12 MED ORDER — MEMANTINE HCL 5 MG PO TABS: 10.0000 mg | ORAL_TABLET | Freq: Two times a day (BID) | ORAL | Status: DC

## 2022-03-12 MED ORDER — DONEPEZIL HCL 5 MG PO TABS
10.0000 mg | ORAL_TABLET | Freq: Every day | ORAL | Status: DC
Start: 2022-03-12 — End: 2022-03-12

## 2022-03-12 NOTE — ED Provider Notes (Addendum)
[facility] Provider Note    Event Date/Time   First MD Initiated Contact with Patient 03/12/22 [phone]     (approximate)   History   Altered Mental Status   HPI  Julian [name] is a 66 y.o. male who was sent from [facility] reportedly completely altered.  [facility] reports he usually does all of his ADLs and is alert and oriented x4.  Patient is part of the pace program and the RN called and spoke with the patient nurse who said he usually does not know where he is or what his birthday is and he does not talk much and walks with assistance but very slowly and is incontinent of urine.  When I see the gentleman he is awake alert speaking very slowly answering questions says nothing is hurting him he is not short of breath he is not coughing he tells me when I ask.  He says he feels fine.  He does have to be asked specific questions to get any information out of him.      Physical Exam   Triage Vital Signs: ED Triage Vitals [03/12/22 0845]  Enc Vitals Group     BP 133/86     Pulse Rate 83     Resp 16     Temp 98.4 F (36.9 C)     Temp Source Oral     SpO2 96 %     Weight      Height      Head Circumference      Peak Flow      Pain Score      Pain Loc      Pain Edu?      Excl. in GC?     Most recent vital signs: Vitals:   03/12/22 0845 03/12/22 1030  BP: 133/86 (!) 125/98  Pulse: 83 63  Resp: 16 16  Temp: 98.4 F (36.9 C)   SpO2: 96% 93%     General: Awake, alert, no distress.  CV:  Good peripheral perfusion.  Heart regular rate and rhythm no audible murmurs Chest: Nontender Resp:  Normal effort.  Lungs Abd:  No distention.  Soft nontender no organomegaly Extremities: Nontender tender no edema Neuro: Patient is not oriented to place.  He cannot tell me where he is at all.  When I tell him that he was sent to the hospital emergency room because he is not acting like himself he says although seems to have surprised.  I  asked him to grab my fingers and squeeze them he gives me very weak grasp bilaterally.  He really is not following most commands.  He does look at me when I speak with him and again he will answer many of my questions.  ED Results / Procedures / Treatments   Labs (all labs ordered are listed, but only abnormal results are displayed) Labs Reviewed  COMPREHENSIVE METABOLIC PANEL - Abnormal; Notable for the following components:      Result Value   Glucose, Bld 100 (*)    All other components within normal limits  CBC - Abnormal; Notable for the following components:   Platelets 81 (*)    All other components within normal limits  LACTIC ACID, PLASMA - Abnormal; Notable for the following components:   Lactic Acid, Venous 2.7 (*)    All other components within normal limits  BLOOD GAS, VENOUS - Abnormal; Notable for the following components:   pO2, Ven 59 (*)  Bicarbonate 28.4 (*)    Acid-Base Excess 2.5 (*)    All other components within normal limits  CBC WITH DIFFERENTIAL/PLATELET - Abnormal; Notable for the following components:   Platelets 79 (*)    All other components within normal limits  LACTIC ACID, PLASMA  URINALYSIS, ROUTINE W REFLEX MICROSCOPIC  URINE DRUG SCREEN, QUALITATIVE ([facility] ONLY)  PROCALCITONIN  CBG MONITORING, ED  TROPONIN I (HIGH SENSITIVITY)  TROPONIN I (HIGH SENSITIVITY)     EKG  EKG read and interpreted by me shows normal sinus rhythm rate of 80 normal axis no obvious acute ST-T wave changes very low amplitude of the lateral chest leads.   RADIOLOGY Chest x-ray read by radiology reviewed and interpreted by me shows a possible hazy left lower lobe infiltrate Head CT read by radiologist reviewed and interpreted by me shows no acute changes. Chest CT read by radiology reviewed and interpreted by me shows no evidence of the apparent infiltrate seen on chest x-ray PROCEDURES:  Critical Care performed: Critical care time 30 minutes.  This involves  reviewing the patient's old records carefully checking on his new studies to be ordering some more studies like a chest CT.  I am talking to the hospitalist.  Procedures   MEDICATIONS ORDERED IN ED: Medications  iohexol (OMNIPAQUE) 350 MG/ML injection 60 mL (60 mLs Intravenous Contrast Given 03/12/22 1056)     IMPRESSION / MDM / ASSESSMENT AND PLAN / ED COURSE  I reviewed the triage vital signs and the nursing notes. Patient with altered mental status.  There is no obvious cause of this.  He is not does not appear to have a seizure.  He does not appear to have any infection he is not short of breath he has no pain he has no fever is not coughing possible chest infiltrate did not pan out.  He is not having an elevated lactic acid unsure why.  Patient not acidotic however. It is possible he has taken extra medication and not been eating.  It is difficult to tell.  I will ask the hospitalist to admit him to we can figure out what is going on.  Patient's presentation is most consistent with acute presentation with potential threat to life or bodily function.  The patient is on the cardiac monitor to evaluate for evidence of arrhythmia and/or significant heart rate changes.  None have been seen      FINAL CLINICAL IMPRESSION(S) / ED DIAGNOSES   Final diagnoses:  Altered mental status, unspecified altered mental status type     Rx / DC Orders   ED Discharge Orders     None        Note:  This document was prepared using Dragon voice recognition software and may include unintentional dictation errors.   Arnaldo Natal, MD 03/12/22 1205 ----------------------------------------- 12:48 PM on 03/12/2022 ----------------------------------------- Patient's Dr. Lajuana Matte came by to see the patient and and examined him and thinks that he is at his baseline.  She request that he be discharged back to his SNF and she will work on titrating his medications there.  She discussed this in  detail with me and I agree.   Arnaldo Natal, MD 03/12/22 1248
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
